# Patient Record
Sex: Female | Born: 1975 | Race: White | Hispanic: No | Marital: Married | Smoking: Former smoker
Health system: Southern US, Community
[De-identification: ages and names within clinical notes are randomized; demographics above are authoritative.]

## PROBLEM LIST (undated history)

## (undated) DIAGNOSIS — T8859XA Other complications of anesthesia, initial encounter: Secondary | ICD-10-CM

## (undated) DIAGNOSIS — R112 Nausea with vomiting, unspecified: Secondary | ICD-10-CM

## (undated) DIAGNOSIS — Z9889 Other specified postprocedural states: Secondary | ICD-10-CM

## (undated) DIAGNOSIS — N159 Renal tubulo-interstitial disease, unspecified: Secondary | ICD-10-CM

## (undated) DIAGNOSIS — Z8719 Personal history of other diseases of the digestive system: Secondary | ICD-10-CM

## (undated) DIAGNOSIS — N179 Acute kidney failure, unspecified: Secondary | ICD-10-CM

## (undated) DIAGNOSIS — J189 Pneumonia, unspecified organism: Secondary | ICD-10-CM

## (undated) DIAGNOSIS — K589 Irritable bowel syndrome without diarrhea: Secondary | ICD-10-CM

## (undated) DIAGNOSIS — T4145XA Adverse effect of unspecified anesthetic, initial encounter: Secondary | ICD-10-CM

## (undated) DIAGNOSIS — C50919 Malignant neoplasm of unspecified site of unspecified female breast: Secondary | ICD-10-CM

## (undated) DIAGNOSIS — K219 Gastro-esophageal reflux disease without esophagitis: Secondary | ICD-10-CM

## (undated) DIAGNOSIS — M1990 Unspecified osteoarthritis, unspecified site: Secondary | ICD-10-CM

## (undated) DIAGNOSIS — M5090 Cervical disc disorder, unspecified, unspecified cervical region: Secondary | ICD-10-CM

## (undated) HISTORY — PX: ABDOMINAL HYSTERECTOMY: SHX81

---

## 1995-10-03 DIAGNOSIS — N179 Acute kidney failure, unspecified: Secondary | ICD-10-CM

## 1995-10-03 HISTORY — DX: Acute kidney failure, unspecified: N17.9

## 2000-06-28 ENCOUNTER — Emergency Department (HOSPITAL_COMMUNITY): Admission: EM | Admit: 2000-06-28 | Discharge: 2000-06-28 | Payer: Self-pay | Admitting: Emergency Medicine

## 2000-06-28 ENCOUNTER — Encounter: Payer: Self-pay | Admitting: Emergency Medicine

## 2001-02-08 ENCOUNTER — Emergency Department (HOSPITAL_COMMUNITY): Admission: EM | Admit: 2001-02-08 | Discharge: 2001-02-08 | Payer: Self-pay | Admitting: Emergency Medicine

## 2001-02-08 ENCOUNTER — Encounter: Payer: Self-pay | Admitting: Emergency Medicine

## 2001-10-04 ENCOUNTER — Ambulatory Visit (HOSPITAL_COMMUNITY): Admission: RE | Admit: 2001-10-04 | Discharge: 2001-10-04 | Payer: Self-pay | Admitting: Gastroenterology

## 2003-10-03 HISTORY — PX: BREAST SURGERY: SHX581

## 2005-10-02 DIAGNOSIS — N159 Renal tubulo-interstitial disease, unspecified: Secondary | ICD-10-CM

## 2005-10-02 HISTORY — DX: Renal tubulo-interstitial disease, unspecified: N15.9

## 2006-04-06 ENCOUNTER — Encounter: Admission: RE | Admit: 2006-04-06 | Discharge: 2006-04-06 | Payer: Self-pay | Admitting: Family Medicine

## 2008-10-12 ENCOUNTER — Other Ambulatory Visit: Admission: RE | Admit: 2008-10-12 | Discharge: 2008-10-12 | Payer: Self-pay | Admitting: Gynecology

## 2008-10-12 ENCOUNTER — Ambulatory Visit: Payer: Self-pay | Admitting: Gynecology

## 2008-10-12 ENCOUNTER — Encounter: Payer: Self-pay | Admitting: Gynecology

## 2008-10-20 ENCOUNTER — Ambulatory Visit: Payer: Self-pay | Admitting: Gynecology

## 2009-01-06 ENCOUNTER — Ambulatory Visit: Payer: Self-pay | Admitting: Gynecology

## 2009-02-04 ENCOUNTER — Ambulatory Visit: Payer: Self-pay | Admitting: Gynecology

## 2009-07-14 ENCOUNTER — Ambulatory Visit: Payer: Self-pay | Admitting: Gynecology

## 2009-07-16 ENCOUNTER — Ambulatory Visit: Payer: Self-pay | Admitting: Gynecology

## 2009-10-02 DIAGNOSIS — J189 Pneumonia, unspecified organism: Secondary | ICD-10-CM

## 2009-10-02 HISTORY — DX: Pneumonia, unspecified organism: J18.9

## 2013-07-22 ENCOUNTER — Emergency Department (HOSPITAL_BASED_OUTPATIENT_CLINIC_OR_DEPARTMENT_OTHER)
Admission: EM | Admit: 2013-07-22 | Discharge: 2013-07-22 | Disposition: A | Discharge status: Home / Self Care | Payer: PRIVATE HEALTH INSURANCE | Attending: Emergency Medicine | Admitting: Emergency Medicine

## 2013-07-22 ENCOUNTER — Encounter (HOSPITAL_BASED_OUTPATIENT_CLINIC_OR_DEPARTMENT_OTHER): Payer: Self-pay | Admitting: Emergency Medicine

## 2013-07-22 DIAGNOSIS — Z880 Allergy status to penicillin: Secondary | ICD-10-CM | POA: Insufficient documentation

## 2013-07-22 DIAGNOSIS — M5412 Radiculopathy, cervical region: Secondary | ICD-10-CM | POA: Insufficient documentation

## 2013-07-22 DIAGNOSIS — G8929 Other chronic pain: Secondary | ICD-10-CM | POA: Insufficient documentation

## 2013-07-22 DIAGNOSIS — F17200 Nicotine dependence, unspecified, uncomplicated: Secondary | ICD-10-CM | POA: Insufficient documentation

## 2013-07-22 HISTORY — DX: Cervical disc disorder, unspecified, unspecified cervical region: M50.90

## 2013-07-22 MED ORDER — ONDANSETRON 4 MG PO TBDP
4.0000 mg | ORAL_TABLET | Freq: Once | ORAL | Status: AC
  Administered 2013-07-22: 4 mg via ORAL
  Filled 2013-07-22: qty 1

## 2013-07-22 MED ORDER — OXYCODONE-ACETAMINOPHEN 5-325 MG PO TABS
1.0000 | ORAL_TABLET | Freq: Once | ORAL | Status: AC
  Administered 2013-07-22: 1 via ORAL
  Filled 2013-07-22: qty 1

## 2013-07-22 MED ORDER — TRAMADOL HCL 50 MG PO TABS: 50.0000 mg | ORAL_TABLET | Freq: Four times a day (QID) | ORAL | Status: DC | PRN

## 2013-07-22 MED ORDER — KETOROLAC TROMETHAMINE 30 MG/ML IJ SOLN
30.0000 mg | Freq: Once | INTRAMUSCULAR | Status: AC
  Administered 2013-07-22: 30 mg via INTRAMUSCULAR
  Filled 2013-07-22: qty 1

## 2013-07-22 NOTE — ED Provider Notes (Signed)
CSN: [id number]     Arrival date & time 07/22/13  2145 History  This chart was scribed for No att. providers found by Ronal Fear, ED Scribe. This patient was seen in room MH10/MH10 and the patient's care was started at 10:12 PM.    Chief Complaint  Patient presents with  . Neck Pain   HPI  HPI Comments: Evelyn Garcia is a 37 y.o. female who presents to the Emergency Department complaining of chronic gradually worsening neck pain for the past 8x months with associated numbness, tingling and weakness with 8/10 pain in her left arm. She states the left arm pain has worsened over the past 4x days. She Has had an MRI that revealed disc damage in C4-6. She was taking ultram for the pain with relief but has now run out. Due to a work related issue she cannot get her medication refilled because she can not get in to see her PCP. Pt denies weakness and numbness in her bilateral legs. Pt has no other medical problems. Pt had a hysterectomy in 2012.  She does not have any other complaints.   Past Medical History  Diagnosis Date  . 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 Abdominal hysterectomy     History reviewed. No pertinent family history. History  Substance Use Topics  . Smoking status: Current Every Day Smoker  . Smokeless tobacco: Not on file  . Alcohol Use: Yes   OB History   Grav Para Term Preterm Abortions TAB SAB Ect Mult Living                 Review of Systems  Genitourinary:       Denies urinary retention  Musculoskeletal: Positive for neck pain.  Neurological: Positive for weakness and numbness. Negative for headaches.    Allergies  Ativan and Penicillins  Home Medications   Current Outpatient Rx  Name  Route  Sig  Dispense  Refill  . traMADol (ULTRAM) 50 MG tablet   Oral   Take 1 tablet (50 mg total) by mouth every 6 (six) hours as needed for pain.   15 tablet   0    BP 132/81  Pulse 106  Temp(Src) 98.1 F (36.7  C) (Oral)  Resp 16  SpO2 99% Physical Exam  Nursing note and vitals reviewed. Constitutional: She is oriented to person, place, and time. She appears well-developed and well-nourished.  HENT:  Head: Normocephalic and atraumatic.  Neck:  TTP over the cervical spine  Cardiovascular: Normal rate, regular rhythm and normal heart sounds.   No murmur heard. Pulmonary/Chest: Effort normal and breath sounds normal. No respiratory distress.  Abdominal: Soft. Bowel sounds are normal. There is no tenderness.  Musculoskeletal: She exhibits no edema.  No deformity noted  Neurological: She is alert and oriented to person, place, and time.  4+/5 strength in LUE with sensation intact, 5/5 strength in all other extremities, normal reflexes, CN II-XII intact  Skin: Skin is warm and dry.  Psychiatric: She has a normal mood and affect.    ED Course  Procedures (including critical care time) DIAGNOSTIC STUDIES: Oxygen Saturation is 99% on RA, normal by my interpretation.    COORDINATION OF CARE:    10:15 PM- Pt advised of plan for treatment including medication for pain and pt agrees.   Labs Review Labs Reviewed - No data to display Imaging Review No results found.  EKG Interpretation  None       MDM   1. Cervical radiculopathy    Patient presents with 8 mo of neck pain and left arm pain and weakness.  Weakness of unchanged but pain increased.  Patient out of medications.  Otherwise, nontoxic.  Patient given toradol and percocet.  Patient will be given a short course of pain medication and is to follow-up with PCP.  Patient already had MRI and has known disc disease.  No indication for further imaging at this time given endorses stable weakness.  After history, exam, and medical workup I feel the patient has been appropriately medically screened and is safe for discharge home. Pertinent diagnoses were discussed with the patient. Patient was given return precautions.   I personally  performed the services described in this documentation, which was scribed in my presence. The recorded information has been reviewed and is accurate.   Shon Baton, MD 07/24/13 [phone]

## 2013-07-22 NOTE — ED Notes (Signed)
Pt c/o neck pain x 8 months with numbness and tingling in L arm. Pt sts she had been diagnosed with C4-C7 disc damage but doesn't have insurance so is unable to follow up with the neurologist she was referred to. Pain score 8/10. A&Ox4 NAd noted.

## 2013-07-22 NOTE — ED Notes (Signed)
Neck pain x 8 months and numbness/tingling down left arm

## 2013-09-19 ENCOUNTER — Encounter (HOSPITAL_COMMUNITY): Payer: Self-pay | Admitting: *Deleted

## 2013-09-19 ENCOUNTER — Other Ambulatory Visit: Payer: Self-pay | Admitting: Neurosurgery

## 2013-09-19 ENCOUNTER — Encounter (HOSPITAL_COMMUNITY): Payer: Self-pay | Admitting: Pharmacy Technician

## 2013-09-21 MED ORDER — DEXAMETHASONE SODIUM PHOSPHATE 10 MG/ML IJ SOLN
10.0000 mg | INTRAMUSCULAR | Status: AC
  Administered 2013-09-22: 10 mg via INTRAVENOUS
  Filled 2013-09-21: qty 1

## 2013-09-22 ENCOUNTER — Encounter (HOSPITAL_COMMUNITY): Payer: Self-pay | Admitting: *Deleted

## 2013-09-22 ENCOUNTER — Ambulatory Visit (HOSPITAL_COMMUNITY)
Admission: RE | Admit: 2013-09-22 | Discharge: 2013-09-23 | Disposition: A | Discharge status: Home / Self Care | Payer: PRIVATE HEALTH INSURANCE | Source: Ambulatory Visit | Attending: Neurosurgery | Admitting: Neurosurgery

## 2013-09-22 ENCOUNTER — Encounter (HOSPITAL_COMMUNITY)
Admission: RE | Disposition: A | Discharge status: Home / Self Care | Payer: Self-pay | Source: Ambulatory Visit | Attending: Neurosurgery

## 2013-09-22 ENCOUNTER — Ambulatory Visit (HOSPITAL_COMMUNITY): Payer: PRIVATE HEALTH INSURANCE | Admitting: Certified Registered"

## 2013-09-22 ENCOUNTER — Encounter (HOSPITAL_COMMUNITY): Payer: PRIVATE HEALTH INSURANCE | Admitting: Certified Registered"

## 2013-09-22 ENCOUNTER — Ambulatory Visit (HOSPITAL_COMMUNITY): Payer: PRIVATE HEALTH INSURANCE

## 2013-09-22 DIAGNOSIS — Z87891 Personal history of nicotine dependence: Secondary | ICD-10-CM | POA: Insufficient documentation

## 2013-09-22 DIAGNOSIS — M5020 Other cervical disc displacement, unspecified cervical region: Secondary | ICD-10-CM | POA: Insufficient documentation

## 2013-09-22 DIAGNOSIS — M5010 Cervical disc disorder with radiculopathy, unspecified cervical region: Secondary | ICD-10-CM | POA: Diagnosis present

## 2013-09-22 HISTORY — DX: Other specified postprocedural states: Z98.890

## 2013-09-22 HISTORY — DX: Other complications of anesthesia, initial encounter: T88.59XA

## 2013-09-22 HISTORY — DX: Renal tubulo-interstitial disease, unspecified: N15.9

## 2013-09-22 HISTORY — DX: Nausea with vomiting, unspecified: R11.2

## 2013-09-22 HISTORY — DX: Gastro-esophageal reflux disease without esophagitis: K21.9

## 2013-09-22 HISTORY — DX: Pneumonia, unspecified organism: J18.9

## 2013-09-22 HISTORY — DX: Unspecified osteoarthritis, unspecified site: M19.90

## 2013-09-22 HISTORY — PX: ANTERIOR CERVICAL DECOMP/DISCECTOMY FUSION: SHX1161

## 2013-09-22 HISTORY — DX: Adverse effect of unspecified anesthetic, initial encounter: T41.45XA

## 2013-09-22 HISTORY — DX: Irritable bowel syndrome, unspecified: K58.9

## 2013-09-22 HISTORY — DX: Acute kidney failure, unspecified: N17.9

## 2013-09-22 HISTORY — DX: Personal history of other diseases of the digestive system: Z87.19

## 2013-09-22 LAB — CBC
MCH: 33.1 pg (ref 26.0–34.0)
MCHC: 34.8 g/dL (ref 30.0–36.0)
MCV: 95 fL (ref 78.0–100.0)
Platelets: 254 10*3/uL (ref 150–400)
RBC: 4.23 MIL/uL (ref 3.87–5.11)
RDW: 12.6 % (ref 11.5–15.5)
WBC: 5.8 10*3/uL (ref 4.0–10.5)

## 2013-09-22 LAB — SURGICAL PCR SCREEN: MRSA, PCR: NEGATIVE

## 2013-09-22 LAB — CREATININE, SERUM: GFR calc non Af Amer: >90 mL/min (ref >90)

## 2013-09-22 SURGERY — ANTERIOR CERVICAL DECOMPRESSION/DISCECTOMY FUSION 2 LEVELS
Anesthesia: General | Site: Neck

## 2013-09-22 MED ORDER — ONDANSETRON HCL 4 MG/2ML IJ SOLN: 4.0000 mg | Freq: Once | INTRAMUSCULAR | Status: DC | PRN

## 2013-09-22 MED ORDER — DOCUSATE SODIUM 100 MG PO CAPS
100.0000 mg | ORAL_CAPSULE | Freq: Two times a day (BID) | ORAL | Status: DC
  Administered 2013-09-22 – 2013-09-23 (×2): 100 mg via ORAL
  Filled 2013-09-22 (×3): qty 1

## 2013-09-22 MED ORDER — HYDROCODONE-ACETAMINOPHEN 5-325 MG PO TABS
1.0000 | ORAL_TABLET | ORAL | Status: DC | PRN
  Administered 2013-09-22 – 2013-09-23 (×5): 2 via ORAL
  Filled 2013-09-22 (×4): qty 2

## 2013-09-22 MED ORDER — MUPIROCIN 2 % EX OINT
TOPICAL_OINTMENT | CUTANEOUS | Status: AC
  Administered 2013-09-22: 1 via NASAL
  Filled 2013-09-22: qty 22

## 2013-09-22 MED ORDER — LIDOCAINE HCL (CARDIAC) 20 MG/ML IV SOLN
INTRAVENOUS | Status: DC | PRN
  Administered 2013-09-22: 40 mg via INTRAVENOUS

## 2013-09-22 MED ORDER — VANCOMYCIN HCL IN DEXTROSE 1-5 GM/200ML-% IV SOLN
1000.0000 mg | INTRAVENOUS | Status: AC
  Administered 2013-09-22: 1000 mg via INTRAVENOUS
  Filled 2013-09-22 (×2): qty 200

## 2013-09-22 MED ORDER — DEXAMETHASONE SODIUM PHOSPHATE 4 MG/ML IJ SOLN
4.0000 mg | Freq: Four times a day (QID) | INTRAMUSCULAR | Status: AC
  Administered 2013-09-22: 4 mg via INTRAVENOUS
  Filled 2013-09-22: qty 1

## 2013-09-22 MED ORDER — HYDROMORPHONE HCL PF 1 MG/ML IJ SOLN
INTRAMUSCULAR | Status: AC
  Filled 2013-09-22: qty 1

## 2013-09-22 MED ORDER — NEOSTIGMINE METHYLSULFATE 1 MG/ML IJ SOLN
INTRAMUSCULAR | Status: DC | PRN
  Administered 2013-09-22: 4 mg via INTRAVENOUS

## 2013-09-22 MED ORDER — SODIUM CHLORIDE 0.9 % IJ SOLN
3.0000 mL | Freq: Two times a day (BID) | INTRAMUSCULAR | Status: DC
  Administered 2013-09-22 (×2): 3 mL via INTRAVENOUS

## 2013-09-22 MED ORDER — HYDROMORPHONE HCL PF 1 MG/ML IJ SOLN
1.0000 mg | INTRAMUSCULAR | Status: DC | PRN
  Administered 2013-09-22 – 2013-09-23 (×5): 1 mg via INTRAMUSCULAR
  Filled 2013-09-22 (×5): qty 1

## 2013-09-22 MED ORDER — CYCLOBENZAPRINE HCL 10 MG PO TABS
10.0000 mg | ORAL_TABLET | Freq: Three times a day (TID) | ORAL | Status: DC | PRN
  Administered 2013-09-22 (×2): 10 mg via ORAL
  Filled 2013-09-22: qty 1

## 2013-09-22 MED ORDER — SODIUM CHLORIDE 0.9 % IJ SOLN: 3.0000 mL | INTRAMUSCULAR | Status: DC | PRN

## 2013-09-22 MED ORDER — MENTHOL 3 MG MT LOZG
1.0000 | LOZENGE | OROMUCOSAL | Status: DC | PRN
  Administered 2013-09-22: 3 mg via ORAL
  Filled 2013-09-22: qty 9

## 2013-09-22 MED ORDER — ACETAMINOPHEN 325 MG PO TABS: 650.0000 mg | ORAL_TABLET | ORAL | Status: DC | PRN

## 2013-09-22 MED ORDER — VANCOMYCIN HCL IN DEXTROSE 1-5 GM/200ML-% IV SOLN
1000.0000 mg | Freq: Once | INTRAVENOUS | Status: AC
  Administered 2013-09-22: 1000 mg via INTRAVENOUS
  Filled 2013-09-22: qty 200

## 2013-09-22 MED ORDER — KCL IN DEXTROSE-NACL 20-5-0.45 MEQ/L-%-% IV SOLN
80.0000 mL/h | INTRAVENOUS | Status: DC
  Filled 2013-09-22 (×3): qty 1000

## 2013-09-22 MED ORDER — 0.9 % SODIUM CHLORIDE (POUR BTL) OPTIME
TOPICAL | Status: DC | PRN
  Administered 2013-09-22: 1000 mL

## 2013-09-22 MED ORDER — THROMBIN 5000 UNITS EX SOLR
CUTANEOUS | Status: DC | PRN
  Administered 2013-09-22 (×2): 5000 [IU] via TOPICAL

## 2013-09-22 MED ORDER — LACTATED RINGERS IV SOLN
INTRAVENOUS | Status: DC | PRN
  Administered 2013-09-22 (×2): via INTRAVENOUS

## 2013-09-22 MED ORDER — MUPIROCIN 2 % EX OINT
TOPICAL_OINTMENT | Freq: Two times a day (BID) | CUTANEOUS | Status: DC
  Filled 2013-09-22: qty 22

## 2013-09-22 MED ORDER — PHENOL 1.4 % MT LIQD
1.0000 | OROMUCOSAL | Status: DC | PRN
  Administered 2013-09-22: 1 via OROMUCOSAL
  Filled 2013-09-22: qty 177

## 2013-09-22 MED ORDER — PROPOFOL 10 MG/ML IV BOLUS
INTRAVENOUS | Status: DC | PRN
  Administered 2013-09-22: 150 mg via INTRAVENOUS

## 2013-09-22 MED ORDER — ARTIFICIAL TEARS OP OINT
TOPICAL_OINTMENT | OPHTHALMIC | Status: DC | PRN
  Administered 2013-09-22: 1 via OPHTHALMIC

## 2013-09-22 MED ORDER — PANTOPRAZOLE SODIUM 40 MG IV SOLR
40.0000 mg | Freq: Every day | INTRAVENOUS | Status: DC
  Administered 2013-09-22: 40 mg via INTRAVENOUS
  Filled 2013-09-22 (×2): qty 40

## 2013-09-22 MED ORDER — HYDROMORPHONE HCL PF 1 MG/ML IJ SOLN
0.2500 mg | INTRAMUSCULAR | Status: DC | PRN
  Administered 2013-09-22 (×2): 0.5 mg via INTRAVENOUS

## 2013-09-22 MED ORDER — ONDANSETRON HCL 4 MG/2ML IJ SOLN
INTRAMUSCULAR | Status: DC | PRN
  Administered 2013-09-22: 4 mg via INTRAVENOUS

## 2013-09-22 MED ORDER — VECURONIUM BROMIDE 10 MG IV SOLR
INTRAVENOUS | Status: DC | PRN
  Administered 2013-09-22: 1 mg via INTRAVENOUS
  Administered 2013-09-22: 2 mg via INTRAVENOUS
  Administered 2013-09-22: 1 mg via INTRAVENOUS

## 2013-09-22 MED ORDER — FENTANYL CITRATE 0.05 MG/ML IJ SOLN
INTRAMUSCULAR | Status: DC | PRN
  Administered 2013-09-22 (×4): 100 ug via INTRAVENOUS
  Administered 2013-09-22 (×2): 50 ug via INTRAVENOUS

## 2013-09-22 MED ORDER — ACETAMINOPHEN 650 MG RE SUPP: 650.0000 mg | RECTAL | Status: DC | PRN

## 2013-09-22 MED ORDER — DEXAMETHASONE 4 MG PO TABS
4.0000 mg | ORAL_TABLET | Freq: Four times a day (QID) | ORAL | Status: AC
  Administered 2013-09-22: 4 mg via ORAL
  Filled 2013-09-22: qty 1

## 2013-09-22 MED ORDER — ZOLPIDEM TARTRATE 5 MG PO TABS: 5.0000 mg | ORAL_TABLET | Freq: Every evening | ORAL | Status: DC | PRN

## 2013-09-22 MED ORDER — ONDANSETRON HCL 4 MG/2ML IJ SOLN: 4.0000 mg | INTRAMUSCULAR | Status: DC | PRN

## 2013-09-22 MED ORDER — CYCLOBENZAPRINE HCL 10 MG PO TABS
ORAL_TABLET | ORAL | Status: AC
  Filled 2013-09-22: qty 1

## 2013-09-22 MED ORDER — MIDAZOLAM HCL 5 MG/5ML IJ SOLN
INTRAMUSCULAR | Status: DC | PRN
  Administered 2013-09-22: 2 mg via INTRAVENOUS

## 2013-09-22 MED ORDER — HEMOSTATIC AGENTS (NO CHARGE) OPTIME
TOPICAL | Status: DC | PRN
  Administered 2013-09-22: 1 via TOPICAL

## 2013-09-22 MED ORDER — HYDROCODONE-ACETAMINOPHEN 5-325 MG PO TABS
ORAL_TABLET | ORAL | Status: AC
  Filled 2013-09-22: qty 2

## 2013-09-22 MED ORDER — ROCURONIUM BROMIDE 100 MG/10ML IV SOLN
INTRAVENOUS | Status: DC | PRN
  Administered 2013-09-22: 50 mg via INTRAVENOUS

## 2013-09-22 MED ORDER — SODIUM CHLORIDE 0.9 % IR SOLN
Status: DC | PRN
  Administered 2013-09-22: 09:00:00

## 2013-09-22 MED ORDER — GLYCOPYRROLATE 0.2 MG/ML IJ SOLN
INTRAMUSCULAR | Status: DC | PRN
  Administered 2013-09-22: 0.6 mg via INTRAVENOUS

## 2013-09-22 SURGICAL SUPPLY — 58 items
APL SKNCLS STERI-STRIP NONHPOA (GAUZE/BANDAGES/DRESSINGS) ×1
BAG DECANTER FOR FLEXI CONT (MISCELLANEOUS) ×2 IMPLANT
BENZOIN TINCTURE PRP APPL 2/3 (GAUZE/BANDAGES/DRESSINGS) ×2 IMPLANT
BIT DRILL TRINICA 2.3MM (BIT) ×1 IMPLANT
BRUSH SCRUB EZ PLAIN DRY (MISCELLANEOUS) ×2 IMPLANT
BUR MATCHSTICK NEURO 3.0 LAGG (BURR) ×2 IMPLANT
CANISTER SUCT 3000ML (MISCELLANEOUS) ×2 IMPLANT
CONT SPEC 4OZ CLIKSEAL STRL BL (MISCELLANEOUS) ×2 IMPLANT
DRAPE C-ARM 42X72 X-RAY (DRAPES) ×4 IMPLANT
DRAPE LAPAROTOMY 100X72 PEDS (DRAPES) ×2 IMPLANT
DRAPE MICROSCOPE ZEISS OPMI (DRAPES) ×2 IMPLANT
DRAPE SURG 17X23 STRL (DRAPES) ×4 IMPLANT
DRESSING TELFA 8X3 (GAUZE/BANDAGES/DRESSINGS) IMPLANT
DRILL BIT TRINICA 2.3MM (BIT) ×2
DRSG OPSITE POSTOP 3X4 (GAUZE/BANDAGES/DRESSINGS) ×2 IMPLANT
DURAPREP 6ML APPLICATOR 50/CS (WOUND CARE) ×2 IMPLANT
ELECT COATED BLADE 2.86 ST (ELECTRODE) ×2 IMPLANT
ELECT REM PT RETURN 9FT ADLT (ELECTROSURGICAL) ×2
ELECTRODE REM PT RTRN 9FT ADLT (ELECTROSURGICAL) ×1 IMPLANT
GAUZE SPONGE 4X4 16PLY XRAY LF (GAUZE/BANDAGES/DRESSINGS) IMPLANT
GLOVE BIO SURGEON STRL SZ8 (GLOVE) ×2 IMPLANT
GLOVE BIOGEL PI IND STRL 7.5 (GLOVE) ×1 IMPLANT
GLOVE BIOGEL PI IND STRL 8.5 (GLOVE) ×1 IMPLANT
GLOVE BIOGEL PI INDICATOR 7.5 (GLOVE) ×1
GLOVE BIOGEL PI INDICATOR 8.5 (GLOVE) ×1
GLOVE ECLIPSE 8.0 STRL XLNG CF (GLOVE) ×2 IMPLANT
GLOVE EXAM NITRILE LRG STRL (GLOVE) IMPLANT
GLOVE EXAM NITRILE MD LF STRL (GLOVE) IMPLANT
GLOVE EXAM NITRILE XL STR (GLOVE) IMPLANT
GLOVE EXAM NITRILE XS STR PU (GLOVE) IMPLANT
GLOVE SURG SS PI 7.0 STRL IVOR (GLOVE) ×6 IMPLANT
GOWN BRE IMP SLV AUR LG STRL (GOWN DISPOSABLE) ×4 IMPLANT
GOWN BRE IMP SLV AUR XL STRL (GOWN DISPOSABLE) ×2 IMPLANT
GOWN STRL REIN 2XL LVL4 (GOWN DISPOSABLE) IMPLANT
HEAD HALTER (SOFTGOODS) ×2 IMPLANT
INTERBODY TM 11X14X5-7DEG ANG (Metal Cage) ×4 IMPLANT
KIT BASIN OR (CUSTOM PROCEDURE TRAY) ×2 IMPLANT
KIT ROOM TURNOVER OR (KITS) ×2 IMPLANT
NEEDLE SPNL 20GX3.5 QUINCKE YW (NEEDLE) ×2 IMPLANT
NS IRRIG 1000ML POUR BTL (IV SOLUTION) ×2 IMPLANT
PACK LAMINECTOMY NEURO (CUSTOM PROCEDURE TRAY) ×2 IMPLANT
PAD ARMBOARD 7.5X6 YLW CONV (MISCELLANEOUS) ×4 IMPLANT
PATTIES SURGICAL .75X.75 (GAUZE/BANDAGES/DRESSINGS) IMPLANT
PLATE 38MM (Plate) ×2 IMPLANT
PUTTY BONE GRAFT KIT 2.5ML (Bone Implant) ×2 IMPLANT
RUBBERBAND STERILE (MISCELLANEOUS) ×4 IMPLANT
SCREW SD FIXED 12MM (Screw) ×12 IMPLANT
SPONGE GAUZE 4X4 12PLY (GAUZE/BANDAGES/DRESSINGS) IMPLANT
SPONGE INTESTINAL PEANUT (DISPOSABLE) ×2 IMPLANT
SPONGE SURGIFOAM ABS GEL SZ50 (HEMOSTASIS) ×2 IMPLANT
STRIP CLOSURE SKIN 1/2X4 (GAUZE/BANDAGES/DRESSINGS) ×2 IMPLANT
SUT PDS AB 5-0 P3 18 (SUTURE) IMPLANT
SUT VIC AB 3-0 CP2 18 (SUTURE) ×4 IMPLANT
SYR 20ML ECCENTRIC (SYRINGE) ×2 IMPLANT
TOWEL OR 17X24 6PK STRL BLUE (TOWEL DISPOSABLE) ×2 IMPLANT
TOWEL OR 17X26 10 PK STRL BLUE (TOWEL DISPOSABLE) ×2 IMPLANT
TRAP SPECIMEN MUCOUS 40CC (MISCELLANEOUS) IMPLANT
WATER STERILE IRR 1000ML POUR (IV SOLUTION) ×2 IMPLANT

## 2013-09-22 NOTE — Preoperative (Signed)
Beta Blockers   Reason not to administer Beta Blockers:Not Applicable 

## 2013-09-22 NOTE — Anesthesia Preprocedure Evaluation (Addendum)
Anesthesia Evaluation  Patient identified by MRN, date of birth, ID band Patient awake    Reviewed: Allergy & Precautions, H&P , NPO status   History of Anesthesia Complications (+) PONV  Airway Mallampati: I TM Distance: >3 FB Neck ROM: Full    Dental  (+) Edentulous Upper, Teeth Intact and Dental Advisory Given   Pulmonary former smoker,  breath sounds clear to auscultation        Cardiovascular Rhythm:Regular Rate:Normal     Neuro/Psych    GI/Hepatic hiatal hernia, GERD-  Medicated and Controlled,  Endo/Other    Renal/GU      Musculoskeletal   Abdominal   Peds  Hematology   Anesthesia Other Findings   Reproductive/Obstetrics                          Anesthesia Physical Anesthesia Plan  ASA: II  Anesthesia Plan: General   Post-op Pain Management:    Induction: Intravenous  Airway Management Planned: Oral ETT  Additional Equipment:   Intra-op Plan:   Post-operative Plan: Extubation in OR  Informed Consent: I have reviewed the patients History and Physical, chart, labs and discussed the procedure including the risks, benefits and alternatives for the proposed anesthesia with the patient or authorized representative who has indicated his/her understanding and acceptance.   Dental advisory given  Plan Discussed with: CRNA and Anesthesiologist  Anesthesia Plan Comments: (Cervical spondylosis GERD Former smoker  Plan GA with oral ETT  Kipp Brood, MD)        Anesthesia Quick Evaluation

## 2013-09-22 NOTE — Anesthesia Procedure Notes (Signed)
Procedure Name: Intubation Date/Time: 09/22/2013 7:55 AM Performed by: De Nurse Pre-anesthesia Checklist: Patient identified, Emergency Drugs available, Suction available, Patient being monitored and Timeout performed Patient Re-evaluated:Patient Re-evaluated prior to inductionOxygen Delivery Method: Circle system utilized Preoxygenation: Pre-oxygenation with 100% oxygen Intubation Type: IV induction Ventilation: Mask ventilation without difficulty Laryngoscope Size: Mac and 3 Grade View: Grade I Tube type: Oral Tube size: 7.0 mm Number of attempts: 1 Airway Equipment and Method: Stylet Placement Confirmation: ETT inserted through vocal cords under direct vision,  positive ETCO2 and breath sounds checked- equal and bilateral Secured at: 21 cm Tube secured with: Tape Dental Injury: Teeth and Oropharynx as per pre-operative assessment

## 2013-09-22 NOTE — Anesthesia Postprocedure Evaluation (Signed)
  Anesthesia Post-op Note  Patient: Evelyn Garcia  Procedure(s) Performed: Procedure(s) with comments: Cervical five-six, Cervical six-seven Anterior Cervical Discectomy and Fusion with Trabecular Metal Plus Plate (N/A) - Cervical five-six, Cervical six-seven Anterior Cervical Discectomy and Fusion with Trabecular Metal Plus Plate  Patient Location: PACU  Anesthesia Type:General  Level of Consciousness: awake, alert  and oriented  Airway and Oxygen Therapy: Patient Spontanous Breathing  Post-op Pain: mild  Post-op Assessment: Post-op Vital signs reviewed, Patient's Cardiovascular Status Stable, Respiratory Function Stable, Patent Airway and Pain level controlled  Post-op Vital Signs: stable  Complications: No apparent anesthesia complications

## 2013-09-22 NOTE — Plan of Care (Signed)
Problem: Consults Goal: Diagnosis - Spinal Surgery Outcome: Completed/Met Date Met:  09/22/13 Cervical Spine Fusion

## 2013-09-22 NOTE — Progress Notes (Signed)
ANTIBIOTIC CONSULT NOTE - INITIAL  Pharmacy Consult for vancomycin Indication: surgical prophylaxis s/p neurosurgery  Allergies  Allergen Reactions  . Erythromycin Base Nausea And Vomiting  . Penicillins     Patient Measurements: Height: 5\' 6"  (167.6 cm) Weight: 158 lb (71.668 kg) IBW/kg (Calculated) : 59.3 Adjusted Body Weight:   Vital Signs: Temp: 98 F (36.7 C) (12/22 1100) Temp src: Oral (12/22 0623) BP: 128/74 mmHg (12/22 1052) Pulse Rate: 91 (12/22 1100) Intake/Output from previous day:   Intake/Output from this shift: Total I/O In: 1000 [I.V.:1000] Out: 50 [Blood:50]  Labs:  Recent Labs  09/22/13 0638  WBC 5.8  HGB 14.0  PLT 254   CrCl is unknown because no creatinine reading has been taken. No results found for this basename: VANCOTROUGH, Leodis Binet, VANCORANDOM, GENTTROUGH, GENTPEAK, GENTRANDOM, TOBRATROUGH, TOBRAPEAK, TOBRARND, AMIKACINPEAK, AMIKACINTROU, AMIKACIN,  in the last 72 hours   Microbiology: Recent Results (from the past 720 hour(s))  SURGICAL PCR SCREEN     Status: None   Collection Time    09/22/13  6:38 AM      Result Value Range Status   MRSA, PCR NEGATIVE  NEGATIVE Final   Staphylococcus aureus NEGATIVE  NEGATIVE Final   Comment:            The Xpert SA Assay (FDA     approved for NASAL specimens     in patients over 27 years of age),     is one component of     a comprehensive surveillance     program.  Test performance has     been validated by [employer] for patients greater     than or equal to 59 year old.     It is not intended     to diagnose infection nor to     guide or monitor treatment.    Medical History: Past Medical History  Diagnosis Date  . 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 Pneumonia 2011  . ARF (acute renal failure) 1997    due to kidney infection  . Kidney infection 2007    due to infection  . GERD (gastroesophageal reflux disease)   . IBS (irritable bowel syndrome)   . H/O hiatal hernia   .  Arthritis   . Complication of anesthesia     wakes up cryimg and hyperventalating  . PONV (postoperative nausea and vomiting)     Medications:  Scheduled:  . cyclobenzaprine      . dexamethasone  4 mg Intravenous Q6H   Or  . dexamethasone  4 mg Oral Q6H  . docusate sodium  100 mg Oral BID  . HYDROcodone-acetaminophen      . HYDROmorphone      . pantoprazole (PROTONIX) IV  40 mg Intravenous QHS  . sodium chloride  3 mL Intravenous Q12H   Infusions:  . dextrose 5 % and 0.45 % NaCl with KCl 20 mEq/L     Assessment: 37 yo female will be put on vancomycin for surgical prophylaxis s/p neurosurgery.  Vancomycin 1g iv x1 was given at 0742.  Per RN, no drain.  Hx of acute renal failure due to infection but SCr is good now at 0.73 (CrCl ~97)  Goal of Therapy:  Vancomycin trough level 15-20 mcg/ml  Plan:  1) Vancomycin 1g iv x1 at 1930 tonight. Pharmacy will sign off  Joliene Salvador, Tsz-Yin 09/22/2013,11:26 AM

## 2013-09-22 NOTE — Transfer of Care (Signed)
Immediate Anesthesia Transfer of Care Note  Patient: Evelyn Garcia  Procedure(s) Performed: Procedure(s) with comments: Cervical five-six, Cervical six-seven Anterior Cervical Discectomy and Fusion with Trabecular Metal Plus Plate (N/A) - Cervical five-six, Cervical six-seven Anterior Cervical Discectomy and Fusion with Trabecular Metal Plus Plate  Patient Location: PACU  Anesthesia Type:General  Level of Consciousness: awake and oriented  Airway & Oxygen Therapy: Patient Spontanous Breathing and Patient connected to nasal cannula oxygen  Post-op Assessment: Report given to PACU RN and Patient moving all extremities X 4  Post vital signs: Reviewed and stable  Complications: No apparent anesthesia complications

## 2013-09-22 NOTE — H&P (Signed)
Evelyn Garcia is an 37 y.o. female.   Chief Complaint: Neck and left greater than right arm pain HPI: The patient is a 37 year old female who presented with neck and bilateral arm pain which was worse on the left and the right. She has had this problem for number of months and was tried aggressive conservative therapy without improvement. She underwent imaging studies which showed disc abnormalities at C5-6 and C6-7. After failing further conservative therapy the patient requested surgery and now comes for a two-level anterior cervical discectomy with fusion and plating. I have had a long discussion with her regarding the risks and benefits of surgical intervention. The risks discussed include but are not limited to bleeding infection weakness numbness paralysis spinal fluid leak coma quadriplegia hoarseness and death. We have discussed alternative methods of therapy along with the risks and benefits of nonintervention. She's had the opportunity to ask numerous questions and appears to understand. With this information in hand she has requested we proceed with surgery. Past Medical History  Diagnosis Date  . 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 Pneumonia 2011  . ARF (acute renal failure) 1997    due to kidney infection  . Kidney infection 2007    due to infection  . GERD (gastroesophageal reflux disease)   . IBS (irritable bowel syndrome)   . H/O hiatal hernia   . Arthritis   . Complication of anesthesia     wakes up cryimg and hyperventalating  . PONV (postoperative nausea and vomiting)     Past Surgical History  Procedure Laterality Date  . Abdominal hysterectomy    . Breast surgery Bilateral 2005    Breast Augmentation    History reviewed. No pertinent family history. Social History:  reports that she quit smoking about 3 weeks ago. She does not have any smokeless tobacco history on file. She reports that she drinks about 1.5 ounces of alcohol per week. She reports that she does not use  illicit drugs.  Allergies:  Allergies  Allergen Reactions  . Erythromycin Base Nausea And Vomiting  . Penicillins     Medications Prior to Admission  Medication Sig Dispense Refill  . omeprazole (PRILOSEC) 40 MG capsule Take 40 mg by mouth daily.      . polyethylene glycol (MIRALAX / GLYCOLAX) packet Take 17 g by mouth daily.      . traMADol (ULTRAM) 50 MG tablet Take 1 tablet (50 mg total) by mouth every 6 (six) hours as needed for pain.  15 tablet  0    Results for orders placed during the hospital encounter of 09/22/13 (from the past 48 hour(s))  CBC     Status: None   Collection Time    09/22/13  6:38 AM      Result Value Range   WBC 5.8  4.0 - 10.5 K/uL   RBC 4.23  3.87 - 5.11 MIL/uL   Hemoglobin 14.0  12.0 - 15.0 g/dL   HCT 16.1  09.6 - 04.5 %   MCV 95.0  78.0 - 100.0 fL   MCH 33.1  26.0 - 34.0 pg   MCHC 34.8  30.0 - 36.0 g/dL   RDW 40.9  81.1 - 91.4 %   Platelets 254  150 - 400 K/uL   No results found.  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 rate 20, height 5'  6" (1.676 m), weight 71.668 kg (158 lb), SpO2 99.00%.  She is awake or and oriented. His no facial asymmetry. Her gait is nonantalgic. Reflexes are somewhat decreased. Her strength however is 5 over 5 except for some mild tricep weakness on the left her sensation is intact Assessment/Plan Impression is that of this disease at C5-6 and C6-7. The plan is for a two-level anterior cervical discectomy with fusion and plating  Reinaldo Meeker, MD 09/22/2013, 7:35 AM

## 2013-09-22 NOTE — Op Note (Signed)
Preop diagnosis: Herniated disc C5-6 C6-7 Postop diagnosis: Same Procedure: C5-6 C6-7 decompressive anterior cervical discectomy with trabecular metal interbody fusion and Trinica anterior cervical plating Surgeon: Elias Dennington Assistant: Venetia Maxon  After and placed in the supine position and 10 pounds halter traction the patient's neck was prepped and draped in the usual sterile fashion. Localizing fluoroscopy was used prior to incision to identify the appropriate levels. Transverse incision was made in the right anterior neck started midline headed towards the medial aspect of the sternal cremaster muscle. The platysma muscle was then incised transversely. The natural fascial plane between the strap muscles medially and the sternal mastoid laterally was identified and followed down to the anterior aspect of the cervical spine. Longus Cole muscles were identified split in the midline and Triple-A bilaterally with unipolar coagulation and Kitner dissection. Self-retaining tract was placed for exposure and x-ray showed approach to the appropriate levels. Using a 15 blade the S. the disc at C5-6 and C6-7 was incised. Using pituitary rongeurs and curettes approximately 90% of the disc material was removed at both levels. High-speed drill was used to widen the interspace and bony shavings were sent for use later in the case. At this time the microscope was draped brought into the field and used for the remainder of the case. Using microdissection technique the remainder of the disc material down to the posterior longitudinal ligament was removed. Ligament was then incised transversely and the cut edges removed a Kerrison punch. Thorough decompression was carried out on the spinal dura into the foramen bilaterally to decompress the nerve roots particularly on the left were segment excised. Attention was then turned to C5-C6 were similar decompression was carried out. Once again generous decompression was carried out on the  spinal dura into the foramen bilaterally until the underlying C6 nerve root were well visualized well decompressed. At this time inspection was carried out at both levels for any evidence of residual compression and none could be identified. 25 mm lordotic trabecular metal graft were chosen and filled a mixture of autologous bone and morselized allograft. Copious irrigation was carried out and any bleeding control proper coagulation Gelfoam. The posterior then impacted of difficulty and fossae showed to be in excellent position. An appropriately length Trinica anterior cervical plate was then chosen under fluoroscopic guidance drill holes were placed followed by placing of 12 mm screws x6. Locking mechanism was rotated to locked position and fossae showed good positioning of the plates screws and plugs. Irrigation was carried out and any bleeding control proper coagulation. The wounds and closed with inverted Vicryl on the platysma muscle in the subcuticular layer and Steri-Strips were placed on the skin. A sterile dressing was applied and the patient was extubated and taken to recovery room in stable condition.

## 2013-09-23 MED ORDER — HYDROCODONE-ACETAMINOPHEN 5-325 MG PO TABS: 1.0000 | ORAL_TABLET | ORAL | Status: DC | PRN

## 2013-09-23 NOTE — Discharge Summary (Signed)
  Physician Discharge Summary  Patient ID: Evelyn Garcia MRN: [id number] DOB/AGE: 1976/05/04 37 y.o.  Admit date: 09/22/2013 Discharge date: 09/23/2013  Admission Diagnoses:  Discharge Diagnoses:  Active Problems:   Herniation of cervical intervertebral disc with radiculopathy   Discharged Condition: good  Hospital Course: Surgery one day for acdf. Did well post op. Good relief of numbness and pain. Wound fine. Ambulated well. Home pod 1, specific instructions given.  Consults: None  Significant Diagnostic Studies: none  Treatments: surgery: C 56 C 67 acdf  Discharge Exam: Blood pressure 120/77, pulse 80, temperature 99 F (37.2 C), temperature source Oral, resp. rate 18, height 5\' 6"  (1.676 m), weight 71.668 kg (158 lb), SpO2 97.00%. clean and dry; no new neuro issues  Disposition: 01-Home or Self Care     Medication List    ASK your doctor about these medications       omeprazole 40 MG capsule  Commonly known as:  PRILOSEC  Take 40 mg by mouth daily.     polyethylene glycol packet  Commonly known as:  MIRALAX / GLYCOLAX  Take 17 g by mouth daily.     traMADol 50 MG tablet  Commonly known as:  ULTRAM  Take 1 tablet (50 mg total) by mouth every 6 (six) hours as needed for pain.         At home rest most of the time. Get up 9 or 10 times each day and take a 15 or 20 minute walk. No riding in the car and to your first postoperative appointment. If you have neck surgery you may shower from the chest down starting on the third postoperative day. If you had back surgery he may start showering on the third postoperative day with saran wrap wrapped around your incisional area 3 times. After the shower remove the saran wrap. Take pain medicine as needed and other medications as instructed. Call my office for an appointment.  SignedReinaldo Meeker, MD 09/23/2013, 12:22 PM

## 2013-09-23 NOTE — Progress Notes (Signed)
Pt. discharged home accompanied by husband. Prescriptions and discharge instructions given with verbalization of understanding. Incision site on neck with no s/s of infection - no swelling, redness, bleeding, and/or drainage noted. Soft collar intact. Pain med given just before leaving. Opportunity given to ask questions but no question asked.  

## 2013-09-24 ENCOUNTER — Encounter (HOSPITAL_COMMUNITY): Payer: Self-pay | Admitting: Neurosurgery

## 2018-03-27 ENCOUNTER — Emergency Department (HOSPITAL_COMMUNITY): Payer: 59

## 2018-03-27 ENCOUNTER — Emergency Department (HOSPITAL_BASED_OUTPATIENT_CLINIC_OR_DEPARTMENT_OTHER)
Admission: EM | Admit: 2018-03-27 | Discharge: 2018-03-27 | Disposition: A | Discharge status: Home / Self Care | Payer: 59 | Attending: Emergency Medicine | Admitting: Emergency Medicine

## 2018-03-27 ENCOUNTER — Emergency Department (HOSPITAL_BASED_OUTPATIENT_CLINIC_OR_DEPARTMENT_OTHER): Payer: 59

## 2018-03-27 ENCOUNTER — Encounter (HOSPITAL_BASED_OUTPATIENT_CLINIC_OR_DEPARTMENT_OTHER): Payer: Self-pay | Admitting: Emergency Medicine

## 2018-03-27 ENCOUNTER — Other Ambulatory Visit: Payer: Self-pay

## 2018-03-27 DIAGNOSIS — R200 Anesthesia of skin: Secondary | ICD-10-CM | POA: Insufficient documentation

## 2018-03-27 DIAGNOSIS — R479 Unspecified speech disturbances: Secondary | ICD-10-CM | POA: Insufficient documentation

## 2018-03-27 DIAGNOSIS — R51 Headache: Secondary | ICD-10-CM | POA: Diagnosis present

## 2018-03-27 DIAGNOSIS — R42 Dizziness and giddiness: Secondary | ICD-10-CM | POA: Insufficient documentation

## 2018-03-27 DIAGNOSIS — Z87891 Personal history of nicotine dependence: Secondary | ICD-10-CM | POA: Insufficient documentation

## 2018-03-27 DIAGNOSIS — R29810 Facial weakness: Secondary | ICD-10-CM | POA: Diagnosis not present

## 2018-03-27 DIAGNOSIS — R519 Headache, unspecified: Secondary | ICD-10-CM

## 2018-03-27 HISTORY — DX: Malignant neoplasm of unspecified site of unspecified female breast: C50.919

## 2018-03-27 LAB — COMPREHENSIVE METABOLIC PANEL
ALT: 21 U/L (ref 0–44)
AST: 21 U/L (ref 15–41)
Albumin: 4.3 g/dL (ref 3.5–5.0)
Alkaline Phosphatase: 64 U/L (ref 38–126)
Anion gap: 8 (ref 5–15)
BUN: 9 mg/dL (ref 6–20)
CO2: 27 mmol/L (ref 22–32)
Calcium: 9.5 mg/dL (ref 8.9–10.3)
Chloride: 107 mmol/L (ref 98–111)
Creatinine, Ser: 0.65 mg/dL (ref 0.44–1.00)
GFR calc Af Amer: >60 mL/min (ref >60)
GFR calc non Af Amer: >60 mL/min (ref >60)
Glucose, Bld: 95 mg/dL (ref 70–99)
Potassium: 4.1 mmol/L (ref 3.5–5.1)
Sodium: 142 mmol/L (ref 135–145)
Total Bilirubin: 0.5 mg/dL (ref 0.3–1.2)
Total Protein: 7.7 g/dL (ref 6.5–8.1)

## 2018-03-27 LAB — DIFFERENTIAL
Basophils Absolute: 0 10*3/uL (ref 0.0–0.1)
Basophils Relative: 1 %
Eosinophils Absolute: 0.2 10*3/uL (ref 0.0–0.7)
Eosinophils Relative: 3 %
Lymphocytes Relative: 42 %
Lymphs Abs: 2.4 10*3/uL (ref 0.7–4.0)
Monocytes Absolute: 0.4 10*3/uL (ref 0.1–1.0)
Monocytes Relative: 7 %
Neutro Abs: 2.7 10*3/uL (ref 1.7–7.7)
Neutrophils Relative %: 47 %

## 2018-03-27 LAB — URINALYSIS, ROUTINE W REFLEX MICROSCOPIC
Bilirubin Urine: NEGATIVE
Glucose, UA: NEGATIVE mg/dL
Hgb urine dipstick: NEGATIVE
Ketones, ur: NEGATIVE mg/dL
Leukocytes, UA: NEGATIVE
Nitrite: NEGATIVE
Protein, ur: NEGATIVE mg/dL
Specific Gravity, Urine: <1.005 — ABNORMAL LOW (ref 1.005–1.030)
pH: 6.5 (ref 5.0–8.0)

## 2018-03-27 LAB — APTT: aPTT: 27 seconds (ref 24–36)

## 2018-03-27 LAB — RAPID URINE DRUG SCREEN, HOSP PERFORMED
AMPHETAMINES: NOT DETECTED
Benzodiazepines: DETECTED — AB
Cocaine: NOT DETECTED
Opiates: NOT DETECTED
TETRAHYDROCANNABINOL: NOT DETECTED

## 2018-03-27 LAB — CBC
HEMATOCRIT: 41.4 % (ref 36.0–46.0)
HEMOGLOBIN: 14 g/dL (ref 12.0–15.0)
MCH: 29.9 pg (ref 26.0–34.0)
MCHC: 33.8 g/dL (ref 30.0–36.0)
MCV: 88.5 fL (ref 78.0–100.0)
Platelets: 223 10*3/uL (ref 150–400)
RBC: 4.68 MIL/uL (ref 3.87–5.11)
RDW: 12.9 % (ref 11.5–15.5)
WBC: 5.7 10*3/uL (ref 4.0–10.5)

## 2018-03-27 LAB — TROPONIN I

## 2018-03-27 LAB — ETHANOL

## 2018-03-27 LAB — PROTIME-INR
INR: 0.95
Prothrombin Time: 12.6 seconds (ref 11.4–15.2)

## 2018-03-27 MED ORDER — ONDANSETRON HCL 4 MG/2ML IJ SOLN
4.0000 mg | Freq: Once | INTRAMUSCULAR | Status: AC
  Administered 2018-03-27: 4 mg via INTRAVENOUS
  Filled 2018-03-27: qty 2

## 2018-03-27 MED ORDER — VALPROATE SODIUM 500 MG/5ML IV SOLN
1000.0000 mg | Freq: Once | INTRAVENOUS | Status: AC
  Administered 2018-03-27: 1000 mg via INTRAVENOUS
  Filled 2018-03-27: qty 10

## 2018-03-27 MED ORDER — IOPAMIDOL (ISOVUE-370) INJECTION 76%
100.0000 mL | Freq: Once | INTRAVENOUS | Status: AC | PRN
  Administered 2018-03-27: 100 mL via INTRAVENOUS

## 2018-03-27 MED ORDER — LORAZEPAM 2 MG/ML IJ SOLN
1.0000 mg | Freq: Once | INTRAMUSCULAR | Status: AC
  Administered 2018-03-27: 1 mg via INTRAVENOUS
  Filled 2018-03-27: qty 1

## 2018-03-27 MED ORDER — DEXAMETHASONE SODIUM PHOSPHATE 10 MG/ML IJ SOLN
10.0000 mg | Freq: Once | INTRAMUSCULAR | Status: AC
  Administered 2018-03-27: 10 mg via INTRAVENOUS
  Filled 2018-03-27: qty 1

## 2018-03-27 MED ORDER — METOCLOPRAMIDE HCL 5 MG/ML IJ SOLN
10.0000 mg | Freq: Once | INTRAMUSCULAR | Status: AC
  Administered 2018-03-27: 10 mg via INTRAVENOUS
  Filled 2018-03-27: qty 2

## 2018-03-27 MED ORDER — KETOROLAC TROMETHAMINE 15 MG/ML IJ SOLN
15.0000 mg | Freq: Once | INTRAMUSCULAR | Status: AC
  Administered 2018-03-27: 15 mg via INTRAVENOUS
  Filled 2018-03-27: qty 1

## 2018-03-27 MED ORDER — SODIUM CHLORIDE 0.9 % IV SOLN
Freq: Once | INTRAVENOUS | Status: AC
  Administered 2018-03-27: 14:00:00 via INTRAVENOUS

## 2018-03-27 MED ORDER — DIPHENHYDRAMINE HCL 50 MG/ML IJ SOLN
50.0000 mg | Freq: Once | INTRAMUSCULAR | Status: AC
  Administered 2018-03-27: 50 mg via INTRAVENOUS
  Filled 2018-03-27: qty 1

## 2018-03-27 MED ORDER — SODIUM CHLORIDE 0.9 % IV BOLUS
1000.0000 mL | Freq: Once | INTRAVENOUS | Status: AC
  Administered 2018-03-27: 1000 mL via INTRAVENOUS

## 2018-03-27 MED ORDER — HYDROMORPHONE HCL 1 MG/ML IJ SOLN
1.0000 mg | Freq: Once | INTRAMUSCULAR | Status: AC
  Administered 2018-03-27: 1 mg via INTRAVENOUS
  Filled 2018-03-27: qty 1

## 2018-03-27 MED ORDER — SODIUM CHLORIDE 0.9 % IV SOLN: INTRAVENOUS | Status: DC

## 2018-03-27 MED ORDER — GABAPENTIN 300 MG PO CAPS: 300.0000 mg | ORAL_CAPSULE | Freq: Three times a day (TID) | ORAL | 0 refills | Status: AC

## 2018-03-27 NOTE — ED Notes (Signed)
Patient transported to MRI 

## 2018-03-27 NOTE — ED Notes (Signed)
Patient verbalizes understanding of discharge instructions. Opportunity for questioning and answers were provided. Armband removed by staff, pt discharged from ED.  

## 2018-03-27 NOTE — ED Provider Notes (Signed)
[facility] EMERGENCY DEPARTMENT Provider Note   CSN: [id number] Arrival date & time: 03/27/18  0830     History   Chief Complaint Chief Complaint  Patient presents with  . Numbness    HPI Evelyn Garcia is a 42 y.o. female.  Patient yesterday at 8 in the morning after being up for a while had acute onset of bilateral frontal headache.  Patient goes to work early uses the chin there works out and showers and dresses at work.  Shortly after the onset of the bilateral forehead headache patient started to have left temporal area burning and numbness and then it progressed down into the left part of her face.  Patient has numbness as well to the left side of the face and there is weakness of the muscles on that side.  Patient denies any other complaints.  However there was some question of some left leg subtle weakness.  Patient had a history of migraines in the past but is never had a complicated migraine.  Speech seems to be off but that could be due to the facial numbness and facial weakness.     Past Medical History:  Diagnosis Date  . ARF (acute renal failure) ([place]) 1997   due to kidney infection  . Arthritis   . Breast cancer ([place])   . 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 Complication of anesthesia    wakes up cryimg and hyperventalating  . GERD (gastroesophageal reflux disease)   . H/O hiatal hernia   . IBS (irritable bowel syndrome)   . Kidney infection 2007   due to infection  . Pneumonia 2011  . PONV (postoperative nausea and vomiting)     Patient Active Problem List   Diagnosis Date Noted  . Herniation of cervical intervertebral disc with radiculopathy 09/22/2013    Past Surgical History:  Procedure Laterality Date  . ABDOMINAL HYSTERECTOMY    . ANTERIOR CERVICAL DECOMP/DISCECTOMY FUSION N/A 09/22/2013   Procedure: Cervical five-six, Cervical six-seven Anterior Cervical Discectomy and Fusion with Trabecular Metal Plus Plate;  Surgeon: Faythe Ghee,  MD;  Location: [facility];  Service: Neurosurgery;  Laterality: N/A;  Cervical five-six, Cervical six-seven Anterior Cervical Discectomy and Fusion with Trabecular Metal Plus Plate  . BREAST SURGERY Bilateral 2005   Breast Augmentation     OB History   None      Home Medications    Prior to Admission medications   Medication Sig Start Date End Date Taking? Authorizing Provider  ALPRAZolam Duanne Moron) 0.25 MG tablet Take 1 tablet by mouth at bedtime as needed. 03/25/18   [provider]    Family History No family history on file.  Social History Social History   Tobacco Use  . Smoking status: Former Smoker    Years: 20.00    Last attempt to quit: 08/30/2013    Years since quitting: 4.5  . Smokeless tobacco: Never Used  Substance Use Topics  . Alcohol use: Yes    Alcohol/week: 1.8 oz    Types: 3 drink(s) per week  . Drug use: No     Allergies   Erythromycin base; Hydrocodone; and Penicillins   Review of Systems Review of Systems  Constitutional: Negative for fever.  HENT: Negative for congestion.   Eyes: Negative for visual disturbance.  Respiratory: Negative for cough and shortness of breath.   Cardiovascular: Negative for chest pain.  Gastrointestinal: Negative for abdominal pain, nausea and vomiting.  Genitourinary: Negative for dysuria.  Musculoskeletal: Negative for back pain and neck pain.  Skin: Negative for rash.  Neurological: Positive for facial asymmetry, speech difficulty, weakness, numbness and headaches. Negative for dizziness, seizures and syncope.  Hematological: Does not bruise/bleed easily.  Psychiatric/Behavioral: Negative for confusion.     Physical Exam Updated Vital Signs BP (!) 93/57   Pulse (!) 59   Temp 97.9 F (36.6 C) (Oral)   Resp 18   Ht 1.702 m (5\' 7" )   Wt 63 kg (139 lb)   SpO2 100%   BMI 21.77 kg/m   Physical Exam  Constitutional: She is oriented to person, place, and time. She appears well-developed and  well-nourished. No distress.  HENT:  Head: Normocephalic and atraumatic.  Mouth/Throat: Oropharynx is clear and moist.  Eyes: Pupils are equal, round, and reactive to light. Conjunctivae and EOM are normal.  Neck: Neck supple.  Cardiovascular: Normal rate, regular rhythm and normal heart sounds.  Pulmonary/Chest: Effort normal and breath sounds normal. No respiratory distress.  Abdominal: Soft. Bowel sounds are normal. There is no tenderness.  Musculoskeletal: Normal range of motion. She exhibits no edema.  Neurological: She is alert and oriented to person, place, and time. A cranial nerve deficit is present. She exhibits abnormal muscle tone.  Patient with left-sided facial weakness with sparing of the forehead.  Patient also with left-sided facial numbness.  Patient with subtle weakness to the left upper extremity and left lower extremity.  Nursing note and vitals reviewed.    ED Treatments / Results  Labs (all labs ordered are listed, but only abnormal results are displayed) Labs Reviewed  RAPID URINE DRUG SCREEN, HOSP PERFORMED - Abnormal; Notable for the following components:      Result Value   Benzodiazepines DETECTED (*)    Barbiturates   (*)    Value: Result not available. Reagent lot number recalled by manufacturer.   All other components within normal limits  URINALYSIS, ROUTINE W REFLEX MICROSCOPIC - Abnormal; Notable for the following components:   Specific Gravity, Urine <1.005 (*)    All other components within normal limits  ETHANOL  PROTIME-INR  APTT  CBC  DIFFERENTIAL  COMPREHENSIVE METABOLIC PANEL  TROPONIN I    EKG EKG Interpretation  Date/Time:  Wednesday March 27 2018 09:53:23 EDT Ventricular Rate:  55 PR Interval:    QRS Duration: 85 QT Interval:  424 QTC Calculation: 406 R Axis:   66 Text Interpretation:  Sinus rhythm Prolonged PR interval No previous ECGs available Confirmed by Fredia Sorrow [phone]) on 03/27/2018 10:02:03 AM   Radiology Ct  Angio Head W Or Wo Contrast  Result Date: 03/27/2018 CLINICAL DATA:  Acute onset of left-sided facial numbness and dizziness beginning this morning. EXAM: CT ANGIOGRAPHY HEAD AND NECK TECHNIQUE: Multidetector CT imaging of the head and neck was performed using the standard protocol during bolus administration of intravenous contrast. Multiplanar CT image reconstructions and MIPs were obtained to evaluate the vascular anatomy. Carotid stenosis measurements (when applicable) are obtained utilizing NASCET criteria, using the distal internal carotid diameter as the denominator. CONTRAST:  163mL ISOVUE-370 IOPAMIDOL (ISOVUE-370) INJECTION 76% COMPARISON:  None. FINDINGS: CT HEAD FINDINGS Brain: The noncontrast images the brain demonstrate no acute or subacute infarction. No hemorrhage or mass lesion is present. Basal ganglia are intact. Insular ribbon is normal. No focal cortical lesions are present. The brainstem and cerebellum are normal. Vascular: No hyperdense vessel or unexpected calcification. Skull: The skull base is within normal limits. Sinuses: The paranasal sinuses and mastoid air cells are  clear. Orbits: Globes and orbits are within normal limits. Review of the MIP images confirms the above findings CTA NECK FINDINGS Aortic arch: A 3 vessel arch configuration is present. Aortic arch is unremarkable. Right carotid system: The right carotid artery is within normal limits. Bifurcation is unremarkable. Cervical right ICA is normal. Left carotid system: The left common carotid artery is within normal limits. Bifurcation is unremarkable. Cervical left ICA is normal. Vertebral arteries: The vertebral arteries originate from the subclavian arteries without significant stenosis. The vertebral arteries are codominant. No focal stenosis or vascular injury is present to either vertebral artery in the neck. Skeleton: Anterior cervical spine fusion present at C5-6 and C6-7. Vertebral body heights and alignment are  otherwise normal. Other neck: The soft tissues of the neck are otherwise unremarkable. Thyroid is normal. No significant adenopathy is present. Salivary glands are within normal limits. Upper chest: Lung apices are clear. Thoracic inlet is within normal limits. Review of the MIP images confirms the above findings CTA HEAD FINDINGS Anterior circulation: Internal carotid arteries are within normal limits from the skull base through the ICA termini bilaterally. The A1 and M1 segments are normal. Anterior communicating artery is patent. MCA bifurcations are normal. The ACA and MCA branch vessels are within normal limits. Posterior circulation: Vertebral arteries are codominant. PICA origins are visualized and within normal limits. Vertebrobasilar junction is within normal limits. Both posterior cerebral arteries originate basilar tip. PCA branch vessels are within normal limits. Venous sinuses: Dural sinuses are patent. The straight sinus and deep cerebral veins are intact. Cortical veins are normal. Anatomic variants: None Delayed phase: The postcontrast images demonstrate no pathologic enhancement gray-white differentiation is preserved. Review of the MIP images confirms the above findings IMPRESSION: 1. Negative CTA of the neck. 2. Normal CTA circle-of-Willis without significant proximal stenosis, aneurysm, or branch vessel occlusion. 3. No acute or focal lesion to explain left-sided facial numbness. 4. Postsurgical changes at C5-6 and C6-7. Electronically Signed   By: San Morelle M.D.   On: 03/27/2018 11:39   Ct Angio Neck W Or Wo Contrast  Result Date: 03/27/2018 CLINICAL DATA:  Acute onset of left-sided facial numbness and dizziness beginning this morning. EXAM: CT ANGIOGRAPHY HEAD AND NECK TECHNIQUE: Multidetector CT imaging of the head and neck was performed using the standard protocol during bolus administration of intravenous contrast. Multiplanar CT image reconstructions and MIPs were obtained to  evaluate the vascular anatomy. Carotid stenosis measurements (when applicable) are obtained utilizing NASCET criteria, using the distal internal carotid diameter as the denominator. CONTRAST:  185mL ISOVUE-370 IOPAMIDOL (ISOVUE-370) INJECTION 76% COMPARISON:  None. FINDINGS: CT HEAD FINDINGS Brain: The noncontrast images the brain demonstrate no acute or subacute infarction. No hemorrhage or mass lesion is present. Basal ganglia are intact. Insular ribbon is normal. No focal cortical lesions are present. The brainstem and cerebellum are normal. Vascular: No hyperdense vessel or unexpected calcification. Skull: The skull base is within normal limits. Sinuses: The paranasal sinuses and mastoid air cells are clear. Orbits: Globes and orbits are within normal limits. Review of the MIP images confirms the above findings CTA NECK FINDINGS Aortic arch: A 3 vessel arch configuration is present. Aortic arch is unremarkable. Right carotid system: The right carotid artery is within normal limits. Bifurcation is unremarkable. Cervical right ICA is normal. Left carotid system: The left common carotid artery is within normal limits. Bifurcation is unremarkable. Cervical left ICA is normal. Vertebral arteries: The vertebral arteries originate from the subclavian arteries without significant stenosis. The  vertebral arteries are codominant. No focal stenosis or vascular injury is present to either vertebral artery in the neck. Skeleton: Anterior cervical spine fusion present at C5-6 and C6-7. Vertebral body heights and alignment are otherwise normal. Other neck: The soft tissues of the neck are otherwise unremarkable. Thyroid is normal. No significant adenopathy is present. Salivary glands are within normal limits. Upper chest: Lung apices are clear. Thoracic inlet is within normal limits. Review of the MIP images confirms the above findings CTA HEAD FINDINGS Anterior circulation: Internal carotid arteries are within normal limits  from the skull base through the ICA termini bilaterally. The A1 and M1 segments are normal. Anterior communicating artery is patent. MCA bifurcations are normal. The ACA and MCA branch vessels are within normal limits. Posterior circulation: Vertebral arteries are codominant. PICA origins are visualized and within normal limits. Vertebrobasilar junction is within normal limits. Both posterior cerebral arteries originate basilar tip. PCA branch vessels are within normal limits. Venous sinuses: Dural sinuses are patent. The straight sinus and deep cerebral veins are intact. Cortical veins are normal. Anatomic variants: None Delayed phase: The postcontrast images demonstrate no pathologic enhancement gray-white differentiation is preserved. Review of the MIP images confirms the above findings IMPRESSION: 1. Negative CTA of the neck. 2. Normal CTA circle-of-Willis without significant proximal stenosis, aneurysm, or branch vessel occlusion. 3. No acute or focal lesion to explain left-sided facial numbness. 4. Postsurgical changes at C5-6 and C6-7. Electronically Signed   By: San Morelle M.D.   On: 03/27/2018 11:39    Procedures Procedures (including critical care time)  CRITICAL CARE Performed by: Fredia Sorrow Total critical care time: 30 minutes Critical care time was exclusive of separately billable procedures and treating other patients. Critical care was necessary to treat or prevent imminent or life-threatening deterioration. Critical care was time spent personally by me on the following activities: development of treatment plan with patient and/or surrogate as well as nursing, discussions with consultants, evaluation of patient's response to treatment, examination of patient, obtaining history from patient or surrogate, ordering and performing treatments and interventions, ordering and review of laboratory studies, ordering and review of radiographic studies, pulse oximetry and re-evaluation  of patient's condition.   Medications Ordered in ED Medications  0.9 %  sodium chloride infusion (has no administration in time range)  sodium chloride 0.9 % bolus 1,000 mL (0 mLs Intravenous Stopped 03/27/18 1211)  metoCLOPramide (REGLAN) injection 10 mg (10 mg Intravenous Given 03/27/18 0958)  diphenhydrAMINE (BENADRYL) injection 50 mg (50 mg Intravenous Given 03/27/18 0958)  dexamethasone (DECADRON) injection 10 mg (10 mg Intravenous Given 03/27/18 0959)  iopamidol (ISOVUE-370) 76 % injection 100 mL (100 mLs Intravenous Contrast Given 03/27/18 1053)     Initial Impression / Assessment and Plan / ED Course  I have reviewed the triage vital signs and the nursing notes.  Pertinent labs & imaging results that were available during my care of the patient were reviewed by me and considered in my medical decision making (see chart for details).     Symptoms concerning for possible stroke but very well could be complicated migraine.  Patient had a history of migraines in the past but is never had a complicated migraine.  Onset of headache being bilateral forehead I think also rules out the possibility of zoster plus the left upper extremity and left lower extremity weakness rules that out as well.  CT head here negative CT angios neck and head without any significant abnormalities.  Discussed with Dr.Kirkpatrick on call neuro  hospitalist who concurs with MRI.  Patient be transferred into [facility] for MRI to rule out CVA.  Patient already treated here with migraine cocktail patient received Decadron 10 mg Benadryl 50 mg and Reglan 10 mg IV and a liter of fluid.  Discussed with Dr. Ardith Dark ED physician at [facility] will accept patient for MRI.  MRI is already been ordered.  Onset of symptoms were yesterday at 8 AM in the morning.  Upon arrival here patient was already beyond 24 hours.  Patient did not meet criteria for Lucianne Lei or for code stroke.  Final Clinical Impressions(s) / ED Diagnoses   Final  diagnoses:  Complicated migraine, intractable    ED Discharge Orders    None       Fredia Sorrow, MD 03/27/18 1303

## 2018-03-27 NOTE — ED Provider Notes (Signed)
3:14 PM discussed with Dr. Leonel Ramsay.  He recommends Toradol and IV Depacon, 1000 mg.  MRI brain.  Probably will need treatment with gabapentin if all else is negative given the burning sensation which could be a neuropathic pain such as trigeminal neuralgia.  Patient is feeling better, burning pain is gone but tingling and some discomfort remains.  MRI is negative.  Lab work is reassuring.  Discussed with Dr. Leonel Ramsay, given trigeminal neuralgia still a possibility, start with gabapentin 300 mg 3 times daily and have her follow-up with neurology.  Otherwise stable for discharge.  Results for orders placed or performed during the hospital encounter of 03/27/18  Ethanol  Result Value Ref Range   Alcohol, Ethyl (B) <10 <10 mg/dL  Protime-INR  Result Value Ref Range   Prothrombin Time 12.6 11.4 - 15.2 seconds   INR 0.95   APTT  Result Value Ref Range   aPTT 27 24 - 36 seconds  CBC  Result Value Ref Range   WBC 5.7 4.0 - 10.5 K/uL   RBC 4.68 3.87 - 5.11 MIL/uL   Hemoglobin 14.0 12.0 - 15.0 g/dL   HCT 41.4 36.0 - 46.0 %   MCV 88.5 78.0 - 100.0 fL   MCH 29.9 26.0 - 34.0 pg   MCHC 33.8 30.0 - 36.0 g/dL   RDW 12.9 11.5 - 15.5 %   Platelets 223 150 - 400 K/uL  Differential  Result Value Ref Range   Neutrophils Relative % 47 %   Neutro Abs 2.7 1.7 - 7.7 K/uL   Lymphocytes Relative 42 %   Lymphs Abs 2.4 0.7 - 4.0 K/uL   Monocytes Relative 7 %   Monocytes Absolute 0.4 0.1 - 1.0 K/uL   Eosinophils Relative 3 %   Eosinophils Absolute 0.2 0.0 - 0.7 K/uL   Basophils Relative 1 %   Basophils Absolute 0.0 0.0 - 0.1 K/uL  Comprehensive metabolic panel  Result Value Ref Range   Sodium 142 135 - 145 mmol/L   Potassium 4.1 3.5 - 5.1 mmol/L   Chloride 107 98 - 111 mmol/L   CO2 27 22 - 32 mmol/L   Glucose, Bld 95 70 - 99 mg/dL   BUN 9 6 - 20 mg/dL   Creatinine, Ser 0.65 0.44 - 1.00 mg/dL   Calcium 9.5 8.9 - 10.3 mg/dL   Total Protein 7.7 6.5 - 8.1 g/dL   Albumin 4.3 3.5 - 5.0 g/dL    AST 21 15 - 41 U/L   ALT 21 0 - 44 U/L   Alkaline Phosphatase 64 38 - 126 U/L   Total Bilirubin 0.5 0.3 - 1.2 mg/dL   GFR calc non Af Amer >60 >60 mL/min   GFR calc Af Amer >60 >60 mL/min   Anion gap 8 5 - 15  Troponin I  Result Value Ref Range   Troponin I <0.03 <0.03 ng/mL  Urine rapid drug screen (hosp performed)  Result Value Ref Range   Opiates NONE DETECTED NONE DETECTED   Cocaine NONE DETECTED NONE DETECTED   Benzodiazepines DETECTED (A) NONE DETECTED   Amphetamines NONE DETECTED NONE DETECTED   Tetrahydrocannabinol NONE DETECTED NONE DETECTED   Barbiturates (A) NONE DETECTED    Result not available. Reagent lot number recalled by manufacturer.  Urinalysis, Routine w reflex microscopic  Result Value Ref Range   Color, Urine YELLOW YELLOW   APPearance CLEAR CLEAR   Specific Gravity, Urine <1.005 (L) 1.005 - 1.030   pH 6.5 5.0 - 8.0   Glucose,  UA NEGATIVE NEGATIVE mg/dL   Hgb urine dipstick NEGATIVE NEGATIVE   Bilirubin Urine NEGATIVE NEGATIVE   Ketones, ur NEGATIVE NEGATIVE mg/dL   Protein, ur NEGATIVE NEGATIVE mg/dL   Nitrite NEGATIVE NEGATIVE   Leukocytes, UA NEGATIVE NEGATIVE   Ct Angio Head W Or Wo Contrast  Result Date: 03/27/2018 CLINICAL DATA:  Acute onset of left-sided facial numbness and dizziness beginning this morning. EXAM: CT ANGIOGRAPHY HEAD AND NECK TECHNIQUE: Multidetector CT imaging of the head and neck was performed using the standard protocol during bolus administration of intravenous contrast. Multiplanar CT image reconstructions and MIPs were obtained to evaluate the vascular anatomy. Carotid stenosis measurements (when applicable) are obtained utilizing NASCET criteria, using the distal internal carotid diameter as the denominator. CONTRAST:  146mL ISOVUE-370 IOPAMIDOL (ISOVUE-370) INJECTION 76% COMPARISON:  None. FINDINGS: CT HEAD FINDINGS Brain: The noncontrast images the brain demonstrate no acute or subacute infarction. No hemorrhage or mass lesion  is present. Basal ganglia are intact. Insular ribbon is normal. No focal cortical lesions are present. The brainstem and cerebellum are normal. Vascular: No hyperdense vessel or unexpected calcification. Skull: The skull base is within normal limits. Sinuses: The paranasal sinuses and mastoid air cells are clear. Orbits: Globes and orbits are within normal limits. Review of the MIP images confirms the above findings CTA NECK FINDINGS Aortic arch: A 3 vessel arch configuration is present. Aortic arch is unremarkable. Right carotid system: The right carotid artery is within normal limits. Bifurcation is unremarkable. Cervical right ICA is normal. Left carotid system: The left common carotid artery is within normal limits. Bifurcation is unremarkable. Cervical left ICA is normal. Vertebral arteries: The vertebral arteries originate from the subclavian arteries without significant stenosis. The vertebral arteries are codominant. No focal stenosis or vascular injury is present to either vertebral artery in the neck. Skeleton: Anterior cervical spine fusion present at C5-6 and C6-7. Vertebral body heights and alignment are otherwise normal. Other neck: The soft tissues of the neck are otherwise unremarkable. Thyroid is normal. No significant adenopathy is present. Salivary glands are within normal limits. Upper chest: Lung apices are clear. Thoracic inlet is within normal limits. Review of the MIP images confirms the above findings CTA HEAD FINDINGS Anterior circulation: Internal carotid arteries are within normal limits from the skull base through the ICA termini bilaterally. The A1 and M1 segments are normal. Anterior communicating artery is patent. MCA bifurcations are normal. The ACA and MCA branch vessels are within normal limits. Posterior circulation: Vertebral arteries are codominant. PICA origins are visualized and within normal limits. Vertebrobasilar junction is within normal limits. Both posterior cerebral  arteries originate basilar tip. PCA branch vessels are within normal limits. Venous sinuses: Dural sinuses are patent. The straight sinus and deep cerebral veins are intact. Cortical veins are normal. Anatomic variants: None Delayed phase: The postcontrast images demonstrate no pathologic enhancement gray-white differentiation is preserved. Review of the MIP images confirms the above findings IMPRESSION: 1. Negative CTA of the neck. 2. Normal CTA circle-of-Willis without significant proximal stenosis, aneurysm, or branch vessel occlusion. 3. No acute or focal lesion to explain left-sided facial numbness. 4. Postsurgical changes at C5-6 and C6-7. Electronically Signed   By: San Morelle M.D.   On: 03/27/2018 11:39   Ct Angio Neck W Or Wo Contrast  Result Date: 03/27/2018 CLINICAL DATA:  Acute onset of left-sided facial numbness and dizziness beginning this morning. EXAM: CT ANGIOGRAPHY HEAD AND NECK TECHNIQUE: Multidetector CT imaging of the head and neck was performed using  the standard protocol during bolus administration of intravenous contrast. Multiplanar CT image reconstructions and MIPs were obtained to evaluate the vascular anatomy. Carotid stenosis measurements (when applicable) are obtained utilizing NASCET criteria, using the distal internal carotid diameter as the denominator. CONTRAST:  198mL ISOVUE-370 IOPAMIDOL (ISOVUE-370) INJECTION 76% COMPARISON:  None. FINDINGS: CT HEAD FINDINGS Brain: The noncontrast images the brain demonstrate no acute or subacute infarction. No hemorrhage or mass lesion is present. Basal ganglia are intact. Insular ribbon is normal. No focal cortical lesions are present. The brainstem and cerebellum are normal. Vascular: No hyperdense vessel or unexpected calcification. Skull: The skull base is within normal limits. Sinuses: The paranasal sinuses and mastoid air cells are clear. Orbits: Globes and orbits are within normal limits. Review of the MIP images confirms  the above findings CTA NECK FINDINGS Aortic arch: A 3 vessel arch configuration is present. Aortic arch is unremarkable. Right carotid system: The right carotid artery is within normal limits. Bifurcation is unremarkable. Cervical right ICA is normal. Left carotid system: The left common carotid artery is within normal limits. Bifurcation is unremarkable. Cervical left ICA is normal. Vertebral arteries: The vertebral arteries originate from the subclavian arteries without significant stenosis. The vertebral arteries are codominant. No focal stenosis or vascular injury is present to either vertebral artery in the neck. Skeleton: Anterior cervical spine fusion present at C5-6 and C6-7. Vertebral body heights and alignment are otherwise normal. Other neck: The soft tissues of the neck are otherwise unremarkable. Thyroid is normal. No significant adenopathy is present. Salivary glands are within normal limits. Upper chest: Lung apices are clear. Thoracic inlet is within normal limits. Review of the MIP images confirms the above findings CTA HEAD FINDINGS Anterior circulation: Internal carotid arteries are within normal limits from the skull base through the ICA termini bilaterally. The A1 and M1 segments are normal. Anterior communicating artery is patent. MCA bifurcations are normal. The ACA and MCA branch vessels are within normal limits. Posterior circulation: Vertebral arteries are codominant. PICA origins are visualized and within normal limits. Vertebrobasilar junction is within normal limits. Both posterior cerebral arteries originate basilar tip. PCA branch vessels are within normal limits. Venous sinuses: Dural sinuses are patent. The straight sinus and deep cerebral veins are intact. Cortical veins are normal. Anatomic variants: None Delayed phase: The postcontrast images demonstrate no pathologic enhancement gray-white differentiation is preserved. Review of the MIP images confirms the above findings  IMPRESSION: 1. Negative CTA of the neck. 2. Normal CTA circle-of-Willis without significant proximal stenosis, aneurysm, or branch vessel occlusion. 3. No acute or focal lesion to explain left-sided facial numbness. 4. Postsurgical changes at C5-6 and C6-7. Electronically Signed   By: San Morelle M.D.   On: 03/27/2018 11:39   Mr Brain Wo Contrast (neuro Protocol)  Result Date: 03/27/2018 CLINICAL DATA:  Bifrontal headache.  Left facial numbness. EXAM: MRI HEAD WITHOUT CONTRAST TECHNIQUE: Multiplanar, multiecho pulse sequences of the brain and surrounding structures were obtained without intravenous contrast. COMPARISON:  Head CT/CTA 03/27/2018 FINDINGS: BRAIN: There is no acute infarct, acute hemorrhage or mass effect. The midline structures are normal. There are no old infarcts. The white matter signal is normal for the patient's age. The CSF spaces are normal for age, with no hydrocephalus. Susceptibility-sensitive sequences show no chronic microhemorrhage or superficial siderosis. VASCULAR: Major intracranial arterial and venous sinus flow voids are preserved. SKULL AND UPPER CERVICAL SPINE: The visualized skull base, calvarium, upper cervical spine and extracranial soft tissues are normal. SINUSES/ORBITS: No fluid levels  or advanced mucosal thickening. No mastoid or middle ear effusion. The orbits are normal. IMPRESSION: Normal brain. Electronically Signed   By: Ulyses Jarred M.D.   On: 03/27/2018 17:43      Sherwood Gambler, MD 03/27/18 [phone]

## 2018-03-27 NOTE — ED Notes (Signed)
Pt returned from MRI °

## 2018-03-27 NOTE — ED Triage Notes (Signed)
Pt having left sided burning and numbness that started at 8 am yesterday at scalp. This am she woke up at 0430 with left sided facial numbness and burning.  Pt states her right arm has a small area of numbness that also started this am.  Pt states she also has a headache.  Grips equal, pt believes left leg feels weak but no deficits detected.  Pt states she has some difficulty speaking due to numbness.

## 2018-03-27 NOTE — Discharge Instructions (Addendum)
If you develop worsening headache/pain, or if you develop vomiting, weakness or numbness in your arms or legs, trouble speaking, fever, or any other new/concerning symptoms return to the ER for evaluation.  Otherwise follow-up with neurology in about 1 week.

## 2018-03-28 ENCOUNTER — Encounter: Payer: Self-pay | Admitting: Neurology

## 2018-04-30 ENCOUNTER — Encounter: Payer: Self-pay | Admitting: Neurology

## 2018-05-13 ENCOUNTER — Ambulatory Visit: Payer: PRIVATE HEALTH INSURANCE | Admitting: Neurology

## 2018-05-15 ENCOUNTER — Ambulatory Visit: Payer: PRIVATE HEALTH INSURANCE | Admitting: Neurology

## 2018-08-29 IMAGING — MR MR HEAD W/O CM
9 of 10 series · 35 of 48 positions shown · non-contrast
Comparison: Head CT/CTA 03/27/2018

CLINICAL DATA: Bifrontal headache.  Left facial numbness.

EXAM:
MRI HEAD WITHOUT CONTRAST
TECHNIQUE: Multiplanar, multiecho pulse sequences of the brain and surrounding
structures were obtained without intravenous contrast.

[Series 3: DWI · axial · 3.0mm · 1.09mm/px · z∈[-63,+88]mm · 8 of 104 slices shown (1 of 4)]
[im 1/104]
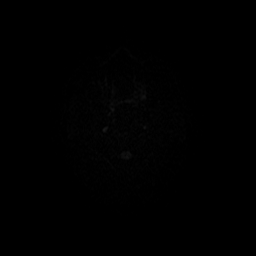
[im 12/104]
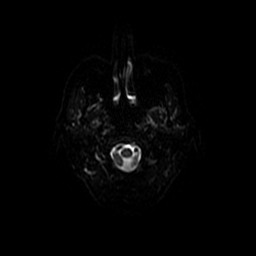
[im 35/104]
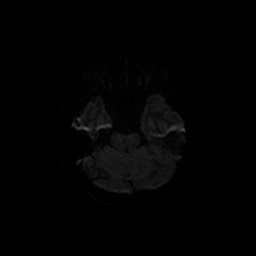
[im 46/104]
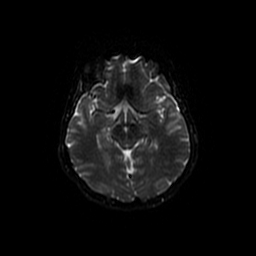
[im 58/104]
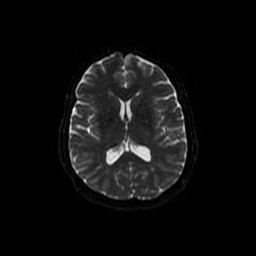
[im 69/104]
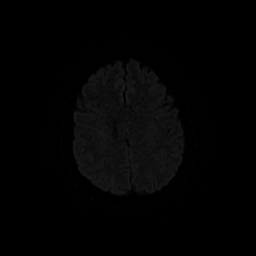
[im 92/104]
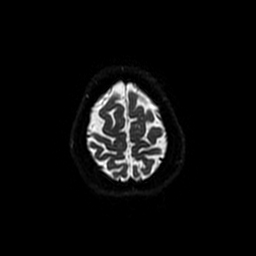
[im 104/104]
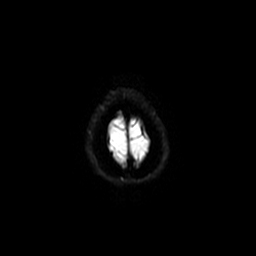

[Series 4: FLAIR · axial · 3.0mm · 0.47mm/px · z∈[-63,+86]mm · 3 of 26 slices shown]
[im 1/26]
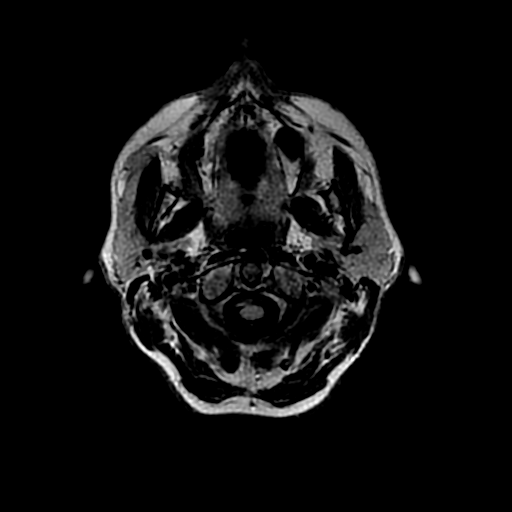
[im 13/26]
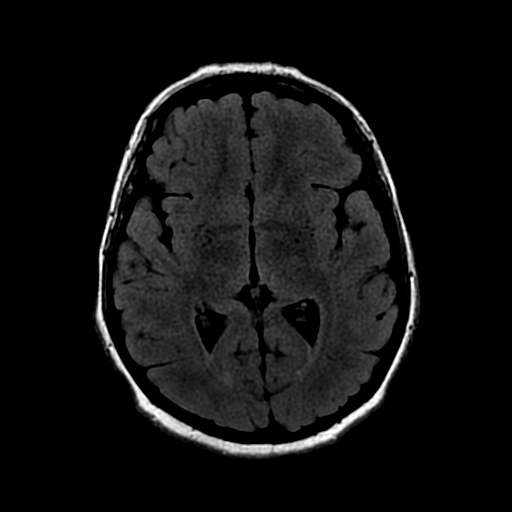
[im 26/26]
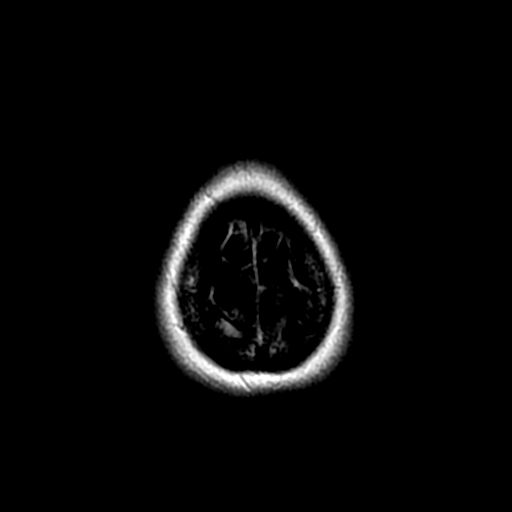

[Series 5: ax mpgr · axial · 5.0mm · 0.43mm/px · 1 of 22 slices shown]
[im 1/22]
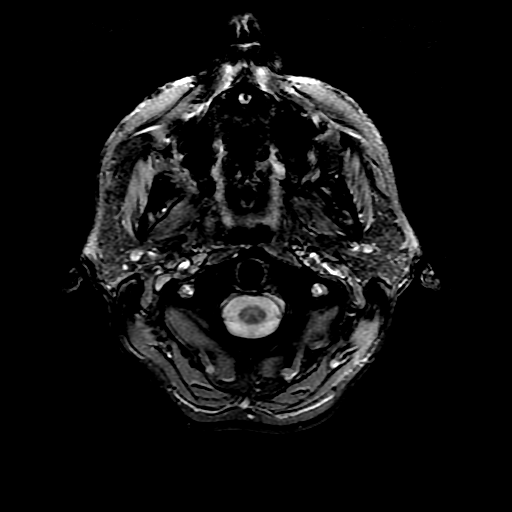

[Series 6: T2 · axial · 5.0mm · 0.47mm/px · z∈[-63,+86]mm · 3 of 26 slices shown (1 of 2)]
[im 1/26]
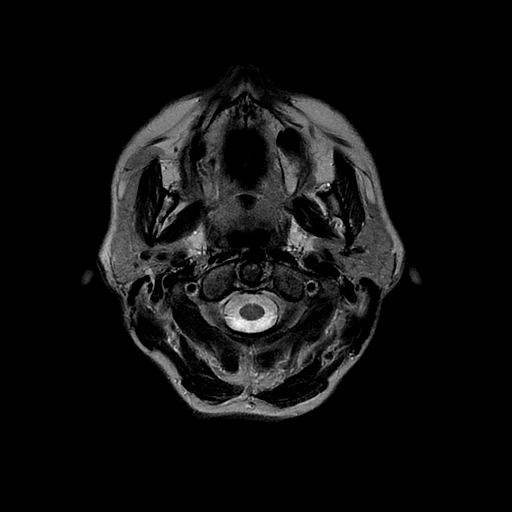
[im 13/26]
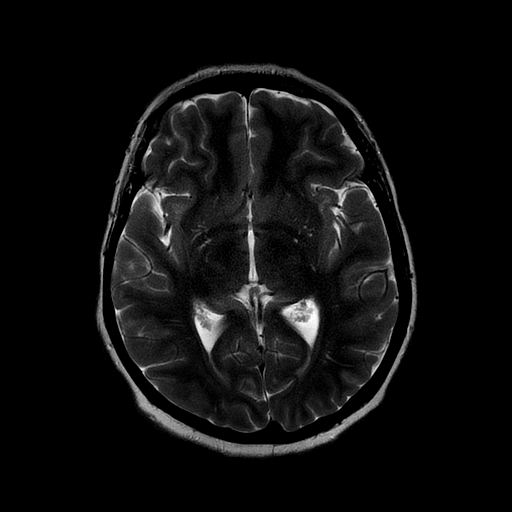
[im 26/26]
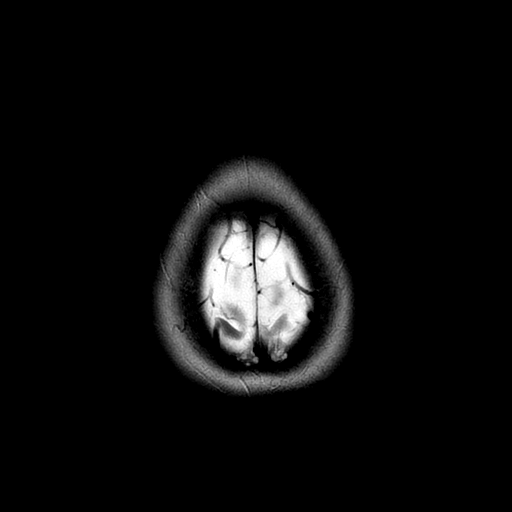

[Series 7: DWI · coronal · 5.0mm · 1.09mm/px · 7 of 68 slices shown (2 of 4)]
[im 1/68]
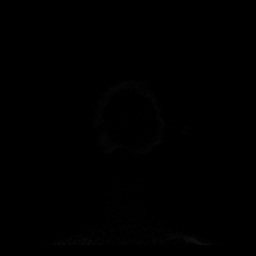
[im 12/68]
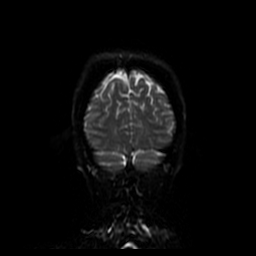
[im 23/68]
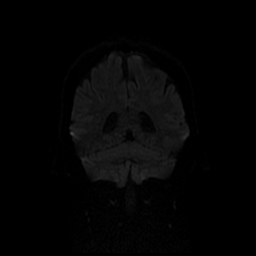
[im 34/68]
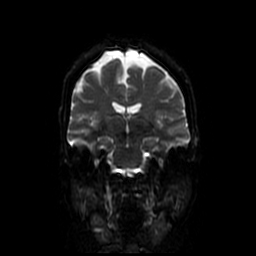
[im 45/68]
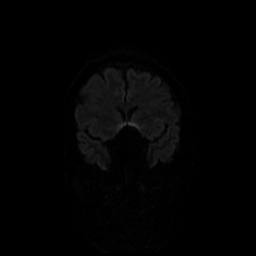
[im 56/68]
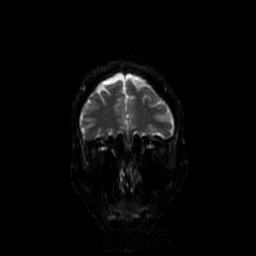
[im 68/68]
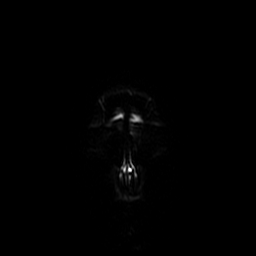

[Series 8: T1 · sagittal · 5.0mm · 0.47mm/px · 2 of 23 slices shown]
[im 1/23]
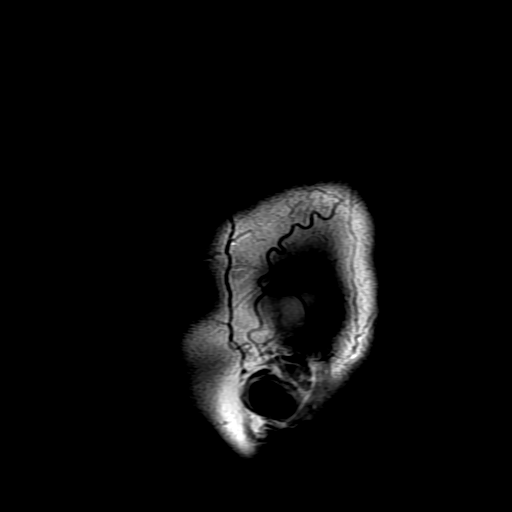
[im 23/23]
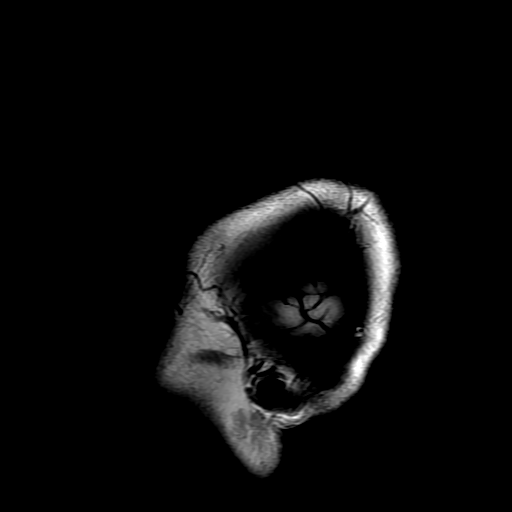

[Series 10: T2 · coronal · 5.0mm · 0.43mm/px · 3 of 29 slices shown (2 of 2)]
[im 1/29]
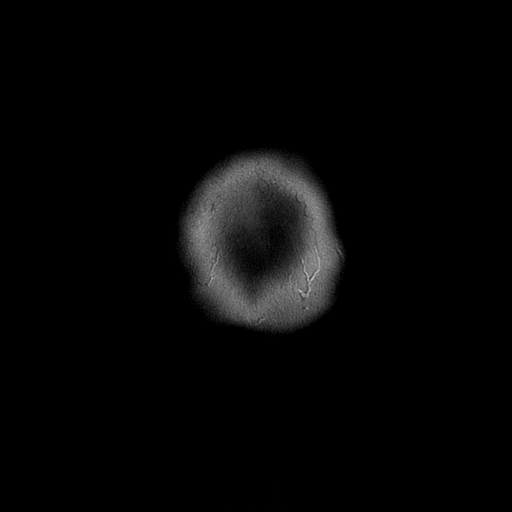
[im 15/29]
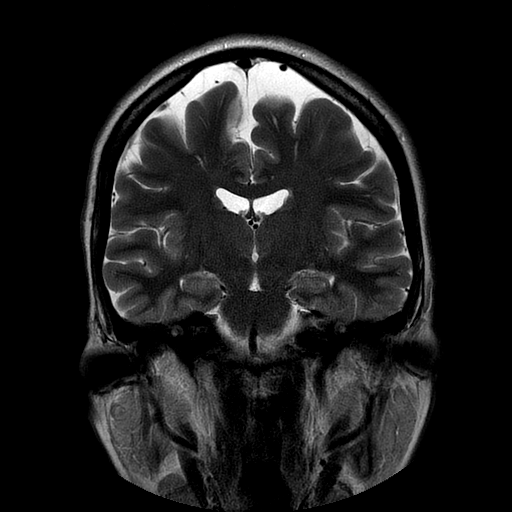
[im 29/29]
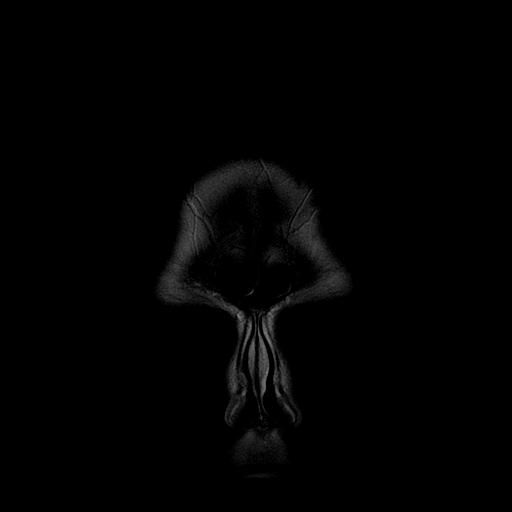

[Series 300: DWI · axial · 3.0mm · 1.09mm/px · z∈[-63,+88]mm · 5 of 52 slices shown (3 of 4)]
[im 1/52]
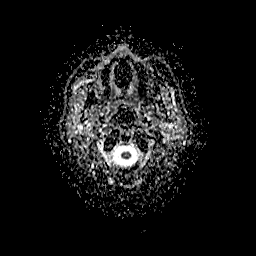
[im 13/52]
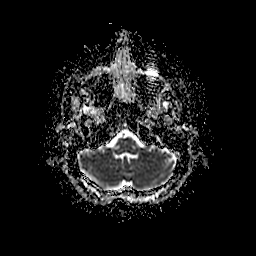
[im 26/52]
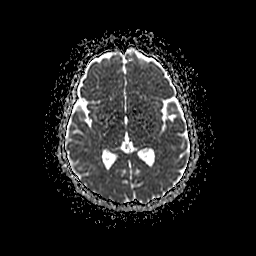
[im 39/52]
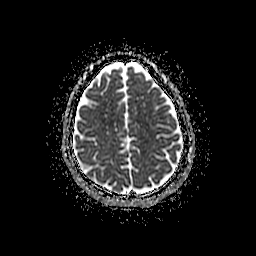
[im 52/52]
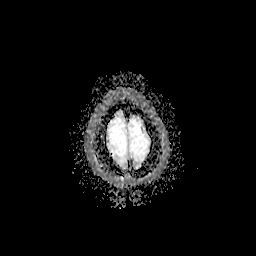

[Series 700: DWI · coronal · 5.0mm · 1.09mm/px · 3 of 34 slices shown (4 of 4)]
[im 1/34]
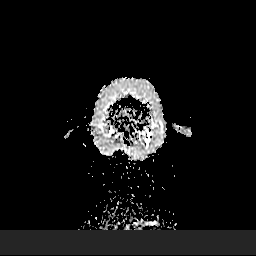
[im 17/34]
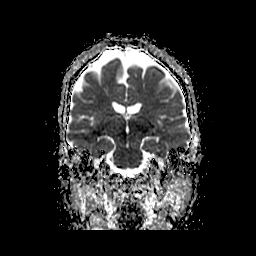
[im 34/34]
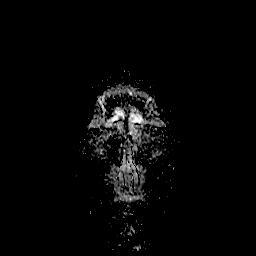

[35 of 48 positions shown; findings below may reference images not displayed]

FINDINGS: BRAIN: There is no acute infarct, acute hemorrhage or mass effect.
The midline structures are normal. There are no old infarcts. The
white matter signal is normal for the patient's age. The CSF spaces
are normal for age, with no hydrocephalus. Susceptibility-sensitive
sequences show no chronic microhemorrhage or superficial siderosis.

VASCULAR: Major intracranial arterial and venous sinus flow voids
are preserved.

SKULL AND UPPER CERVICAL SPINE: The visualized skull base,
calvarium, upper cervical spine and extracranial soft tissues are
normal.

SINUSES/ORBITS: No fluid levels or advanced mucosal thickening. No
mastoid or middle ear effusion. The orbits are normal.
IMPRESSION: Normal brain.

## 2022-04-27 ENCOUNTER — Encounter (HOSPITAL_BASED_OUTPATIENT_CLINIC_OR_DEPARTMENT_OTHER): Payer: Self-pay

## 2022-04-27 ENCOUNTER — Other Ambulatory Visit: Payer: Self-pay

## 2022-04-27 ENCOUNTER — Emergency Department (HOSPITAL_BASED_OUTPATIENT_CLINIC_OR_DEPARTMENT_OTHER)
Admission: EM | Admit: 2022-04-27 | Discharge: 2022-04-27 | Disposition: A | Discharge status: Home / Self Care | Payer: 59 | Attending: Emergency Medicine | Admitting: Emergency Medicine

## 2022-04-27 ENCOUNTER — Emergency Department (HOSPITAL_BASED_OUTPATIENT_CLINIC_OR_DEPARTMENT_OTHER): Payer: 59

## 2022-04-27 DIAGNOSIS — H53129 Transient visual loss, unspecified eye: Secondary | ICD-10-CM | POA: Diagnosis not present

## 2022-04-27 DIAGNOSIS — R0602 Shortness of breath: Secondary | ICD-10-CM | POA: Insufficient documentation

## 2022-04-27 DIAGNOSIS — R0789 Other chest pain: Secondary | ICD-10-CM

## 2022-04-27 DIAGNOSIS — R42 Dizziness and giddiness: Secondary | ICD-10-CM | POA: Diagnosis present

## 2022-04-27 DIAGNOSIS — H539 Unspecified visual disturbance: Secondary | ICD-10-CM

## 2022-04-27 DIAGNOSIS — Z853 Personal history of malignant neoplasm of breast: Secondary | ICD-10-CM | POA: Insufficient documentation

## 2022-04-27 DIAGNOSIS — R55 Syncope and collapse: Secondary | ICD-10-CM | POA: Insufficient documentation

## 2022-04-27 DIAGNOSIS — R072 Precordial pain: Secondary | ICD-10-CM | POA: Diagnosis not present

## 2022-04-27 DIAGNOSIS — R002 Palpitations: Secondary | ICD-10-CM | POA: Diagnosis not present

## 2022-04-27 LAB — CBC WITH DIFFERENTIAL/PLATELET
Abs Immature Granulocytes: 0.01 10*3/uL (ref 0.00–0.07)
Basophils Absolute: 0.1 10*3/uL (ref 0.0–0.1)
Basophils Relative: 1 %
Eosinophils Absolute: 0.2 10*3/uL (ref 0.0–0.5)
Eosinophils Relative: 3 %
HCT: 38.9 % (ref 36.0–46.0)
Hemoglobin: 13.1 g/dL (ref 12.0–15.0)
Immature Granulocytes: 0 %
Lymphocytes Relative: 39 %
Lymphs Abs: 2.3 10*3/uL (ref 0.7–4.0)
MCH: 30.3 pg (ref 26.0–34.0)
MCHC: 33.7 g/dL (ref 30.0–36.0)
MCV: 89.8 fL (ref 80.0–100.0)
Monocytes Absolute: 0.4 10*3/uL (ref 0.1–1.0)
Monocytes Relative: 8 %
Neutro Abs: 2.9 10*3/uL (ref 1.7–7.7)
Neutrophils Relative %: 49 %
Platelets: 290 10*3/uL (ref 150–400)
RBC: 4.33 MIL/uL (ref 3.87–5.11)
RDW: 12.5 % (ref 11.5–15.5)
WBC: 5.9 10*3/uL (ref 4.0–10.5)
nRBC: 0 % (ref 0.0–0.2)

## 2022-04-27 LAB — COMPREHENSIVE METABOLIC PANEL
ALT: 46 U/L — ABNORMAL HIGH (ref 0–44)
AST: 31 U/L (ref 15–41)
Albumin: 4.4 g/dL (ref 3.5–5.0)
Alkaline Phosphatase: 82 U/L (ref 38–126)
Anion gap: 8 (ref 5–15)
BUN: 7 mg/dL (ref 6–20)
CO2: 26 mmol/L (ref 22–32)
Calcium: 9.9 mg/dL (ref 8.9–10.3)
Chloride: 105 mmol/L (ref 98–111)
Creatinine, Ser: 0.77 mg/dL (ref 0.44–1.00)
GFR, Estimated: >60 mL/min (ref >60)
Glucose, Bld: 105 mg/dL — ABNORMAL HIGH (ref 70–99)
Potassium: 3.8 mmol/L (ref 3.5–5.1)
Sodium: 139 mmol/L (ref 135–145)
Total Bilirubin: 0.4 mg/dL (ref 0.3–1.2)
Total Protein: 6.9 g/dL (ref 6.5–8.1)

## 2022-04-27 LAB — TROPONIN I (HIGH SENSITIVITY)
Troponin I (High Sensitivity): <2 ng/L (ref <18)
Troponin I (High Sensitivity): <2 ng/L (ref <18)

## 2022-04-27 LAB — LIPASE, BLOOD: Lipase: 37 U/L (ref 11–51)

## 2022-04-27 LAB — TSH: TSH: 2.097 u[IU]/mL (ref 0.350–4.500)

## 2022-04-27 MED ORDER — DIPHENHYDRAMINE HCL 50 MG/ML IJ SOLN
50.0000 mg | Freq: Once | INTRAMUSCULAR | Status: AC
  Administered 2022-04-27: 50 mg via INTRAVENOUS
  Filled 2022-04-27: qty 1

## 2022-04-27 MED ORDER — IOHEXOL 350 MG/ML SOLN
100.0000 mL | Freq: Once | INTRAVENOUS | Status: AC | PRN
  Administered 2022-04-27: 150 mL via INTRAVENOUS

## 2022-04-27 MED ORDER — SODIUM CHLORIDE 0.9 % IV BOLUS
1000.0000 mL | Freq: Once | INTRAVENOUS | Status: AC
  Administered 2022-04-27: 1000 mL via INTRAVENOUS

## 2022-04-27 MED ORDER — PROCHLORPERAZINE EDISYLATE 10 MG/2ML IJ SOLN
10.0000 mg | Freq: Once | INTRAMUSCULAR | Status: AC
  Administered 2022-04-27: 10 mg via INTRAVENOUS
  Filled 2022-04-27: qty 2

## 2022-04-27 NOTE — ED Provider Notes (Signed)
[facility] Provider Note   CSN: [id number] Arrival date & time: 04/27/22  0857     History  Chief Complaint  Patient presents with   Chest Pain    Evelyn Garcia is a 46 y.o. female.  The history is provided by the patient, the spouse and medical records. No language interpreter was used.  Chest Pain Pain location:  Substernal area and L chest Pain quality: aching, dull and pressure   Pain radiates to:  Does not radiate Pain severity:  Severe Onset quality:  Sudden Duration:  2 days Timing:  Intermittent Progression:  Waxing and waning Chronicity:  New Relieved by:  Nothing Worsened by:  Nothing Ineffective treatments:  None tried Associated symptoms: anxiety, dizziness, fatigue, headache, nausea, near-syncope, palpitations and shortness of breath   Associated symptoms: no abdominal pain, no back pain, no cough, no diaphoresis, no fever, no lower extremity edema, no numbness, no vomiting and no weakness        Home Medications Prior to Admission medications   Medication Sig Start Date End Date Taking? Authorizing Provider  ALPRAZolam Duanne Moron) 0.25 MG tablet Take 1 tablet by mouth at bedtime as needed. 03/25/18   [provider]  gabapentin (NEURONTIN) 300 MG capsule Take 1 capsule (300 mg total) by mouth 3 (three) times daily for 7 days. 03/27/18 04/03/18  Sherwood Gambler, MD      Allergies    Erythromycin base, Hydrocodone, Penicillins, and Prednisone    Review of Systems   Review of Systems  Constitutional:  Positive for fatigue. Negative for diaphoresis and fever.  HENT:  Negative for congestion.   Eyes:  Positive for visual disturbance.  Respiratory:  Positive for chest tightness and shortness of breath. Negative for cough, wheezing and stridor.   Cardiovascular:  Positive for chest pain, palpitations and near-syncope. Negative for leg swelling.  Gastrointestinal:  Positive for nausea. Negative for abdominal pain,  constipation, diarrhea and vomiting.  Genitourinary:  Negative for dysuria and flank pain.  Musculoskeletal:  Negative for back pain, neck pain and neck stiffness.  Skin:  Negative for rash and wound.  Neurological:  Positive for dizziness, light-headedness and headaches. Negative for seizures, syncope, facial asymmetry, speech difficulty, weakness and numbness.  Psychiatric/Behavioral:  Negative for agitation and confusion.   All other systems reviewed and are negative.   Physical Exam Updated Vital Signs BP 115/77 (BP Location: Right Arm)   Pulse 75   Temp 97.6 F (36.4 C) (Oral)   Resp 11   Ht '5\' 6"'$  (1.676 m)   Wt 78.1 kg   SpO2 99%   BMI 27.79 kg/m  Physical Exam Vitals and nursing note reviewed.  Constitutional:      General: She is not in acute distress.    Appearance: She is well-developed. She is not ill-appearing, toxic-appearing or diaphoretic.  HENT:     Head: Normocephalic and atraumatic.  Eyes:     Conjunctiva/sclera: Conjunctivae normal.     Pupils: Pupils are equal, round, and reactive to light.  Cardiovascular:     Rate and Rhythm: Normal rate and regular rhythm.     Heart sounds: Normal heart sounds. No murmur heard. Pulmonary:     Effort: Pulmonary effort is normal. No respiratory distress.     Breath sounds: Normal breath sounds. No decreased breath sounds, wheezing, rhonchi or rales.  Chest:     Chest wall: No tenderness.  Abdominal:     General: Bowel sounds are normal.     Palpations:  Abdomen is soft.     Tenderness: There is no abdominal tenderness. There is no guarding or rebound.  Musculoskeletal:        General: No swelling. Normal range of motion.     Cervical back: Neck supple.     Right lower leg: No tenderness. No edema.     Left lower leg: No tenderness. No edema.  Skin:    General: Skin is warm and dry.     Capillary Refill: Capillary refill takes less than 2 seconds.     Coloration: Skin is not pale.     Findings: No erythema.   Neurological:     General: No focal deficit present.     Mental Status: She is alert.  Psychiatric:        Mood and Affect: Mood normal.    ED Results / Procedures / Treatments   Labs (all labs ordered are listed, but only abnormal results are displayed) Labs Reviewed  COMPREHENSIVE METABOLIC PANEL - Abnormal; Notable for the following components:      Result Value   Glucose, Bld 105 (*)    ALT 46 (*)    All other components within normal limits  CBC WITH DIFFERENTIAL/PLATELET  LIPASE, BLOOD  TSH  TROPONIN I (HIGH SENSITIVITY)  TROPONIN I (HIGH SENSITIVITY)    EKG EKG Interpretation  Date/Time:  Thursday April 27 2022 09:08:20 EDT Ventricular Rate:  68 PR Interval:  218 QRS Duration: 88 QT Interval:  399 QTC Calculation: 425 R Axis:   53 Text Interpretation: Sinus rhythm Prolonged PR interval Low voltage, precordial leads Baseline wander in lead(s) V5 when compared to prior, more wandering baseline. No sTEMI Confirmed by Antony Blackbird [phone]) on 04/27/2022 9:31:31 AM  Radiology CT ANGIO HEAD NECK W WO CM  Result Date: 04/27/2022 CLINICAL DATA:  Syncope/presyncope, cerebrovascular cause suspected History of cancer, chest pain, short of breath, near syncope, also headache, dizziness, and vision sliding to the left during episode yesterday. EXAM: CT ANGIOGRAPHY HEAD AND NECK TECHNIQUE: Multidetector CT imaging of the head and neck was performed using the standard protocol during bolus administration of intravenous contrast. Multiplanar CT image reconstructions and MIPs were obtained to evaluate the vascular anatomy. Carotid stenosis measurements (when applicable) are obtained utilizing NASCET criteria, using the distal internal carotid diameter as the denominator. RADIATION DOSE REDUCTION: This exam was performed according to the departmental dose-optimization program which includes automated exposure control, adjustment of the mA and/or kV according to patient size and/or use of  iterative reconstruction technique. CONTRAST:  118m OMNIPAQUE IOHEXOL 350 MG/ML SOLN COMPARISON:  CTA March 27, 2018. FINDINGS: CT HEAD FINDINGS Brain: No evidence of acute large vascular territory infarction, hemorrhage, hydrocephalus, extra-axial collection or mass lesion/mass effect. Vascular: See below. Skull: No acute fracture. Sinuses: Clear sinuses. Orbits: No acute finding. Review of the MIP images confirms the above findings CTA NECK FINDINGS Aortic arch: Great vessel origins are patent. Right carotid system: No evidence of dissection, stenosis (50% or greater) or occlusion. Left carotid system: No evidence of dissection, stenosis (50% or greater) or occlusion. Vertebral arteries: Codominant. No visible evidence of dissection, stenosis (50% or greater) or occlusion. Streak artifact from ACDF limits assessment. Skeleton: C5 C7 ACDF with interbody spacers. Other neck: No acute findings. Upper chest: Visualized lung apices are clear. Review of the MIP images confirms the above findings CTA HEAD FINDINGS Anterior circulation: Bilateral intracranial ICAs, MCAs, and ACAs are patent without proximal hemodynamically significant stenosis. Posterior circulation: Bilateral intradural vertebral arteries basilar arteries, and  bilateral posterior cerebral arteries are patent without proximal hemodynamically significant stenosis. Venous sinuses: As permitted by contrast timing, patent. Review of the MIP images confirms the above findings IMPRESSION: 1. No evidence of acute intracranial abnormality. 2. No emergent large vessel occlusion or proximal hemodynamically significant stenosis in the head or neck. Electronically Signed   By: Margaretha Sheffield M.D.   On: 04/27/2022 11:55   CT Angio Chest PE W and/or Wo Contrast  Result Date: 04/27/2022 CLINICAL DATA:  Pulmonary embolism suspected, high probability. History cancer. Chest pain with shortness of breath and near syncopal episode. EXAM: CT ANGIOGRAPHY CHEST WITH  CONTRAST TECHNIQUE: Multidetector CT imaging of the chest was performed using the standard protocol during bolus administration of intravenous contrast. Multiplanar CT image reconstructions and MIPs were obtained to evaluate the vascular anatomy. Two acquisitions were obtained. RADIATION DOSE REDUCTION: This exam was performed according to the departmental dose-optimization program which includes automated exposure control, adjustment of the mA and/or kV according to patient size and/or use of iterative reconstruction technique. CONTRAST:  178m OMNIPAQUE IOHEXOL 350 MG/ML SOLN COMPARISON:  Chest radiographs 09/07/2021 and 10/13/2020. FINDINGS: Cardiovascular: The pulmonary arteries are well opacified with contrast to the level of the subsegmental branches. There is no evidence of acute pulmonary embolism. No acute or significant systemic arterial abnormalities are seen. The heart size is normal. There is no pericardial effusion. Mediastinum/Nodes: There are no enlarged mediastinal, hilar or axillary lymph nodes. The thyroid gland, trachea and esophagus demonstrate no significant findings. Lungs/Pleura: No pleural effusion or pneumothorax. Mild dependent atelectasis in both lungs. No confluent airspace opacity or suspicious pulmonary nodule. Upper abdomen: No significant findings are seen within the visualized upper abdomen. Musculoskeletal/Chest wall: There is no chest wall mass or suspicious osseous finding. Bilateral breast implants are noted. Previous lower cervical fusion. Review of the MIP images confirms the above findings. IMPRESSION: 1. No evidence of acute pulmonary embolism or other acute chest findings. 2. Mild dependent atelectasis in both lungs. Electronically Signed   By: WRichardean SaleM.D.   On: 04/27/2022 11:53    Procedures Procedures    Medications Ordered in ED Medications  iohexol (OMNIPAQUE) 350 MG/ML injection 100 mL (150 mLs Intravenous Contrast Given 04/27/22 1139)  sodium chloride  0.9 % bolus 1,000 mL (0 mLs Intravenous Stopped 04/27/22 1404)  prochlorperazine (COMPAZINE) injection 10 mg (10 mg Intravenous Given 04/27/22 1229)  diphenhydrAMINE (BENADRYL) injection 50 mg (50 mg Intravenous Given 04/27/22 1228)    ED Course/ Medical Decision Making/ A&P                           Medical Decision Making Amount and/or Complexity of Data Reviewed Labs: ordered. Radiology: ordered.  Risk Prescription drug management.    MBabe Anthisis a 46y.o. female with a past medical history significant for breast cancer status post bilateral mastectomy, hysterectomy, previous neck surgery with chronic left arm numbness/tingling symptoms, IBS, previous pneumonia, GERD, and hiatal hernia who presents with 2 days of intermittent chest pressure, near syncope with lightheadedness, dizziness with left shifting vision, sense of dread, short of breath, and palpitations.  According to patient, she was feeling fatigued for the last few days but did not think much of it.  She says that while at work yesterday she had onset of some palpitations and chest pressure that felt like a weight on her chest.  It did not radiate but she did say that her left arm numbness seem to  be slightly more intense than baseline.  She said that she started having headache and then started having some lightheadedness and near syncope.  She then started having some dizziness and felt that her vision Jumping to the left and everything was falling to the left.  She reports this lasted for several minutes.  She reports no trauma.  Denies any fevers, chills, congestion, or cough.  She reports some nausea but no vomiting.  Denies any constipation, diarrhea, or urinary symptoms.  Denies history of DVT or PE and denies any leg pain or leg swelling.  She reports that it happened yesterday twice and then today prompting her to seek evaluation.  She reports her headache is frontal and diffuse and is like a pressure.  Denies any persistent  vision changes at this time.  On exam, lungs clear.  Chest nontender.  Abdomen nontender.  Normal bowel sounds.  No murmur.  Intact pulses, strength, and sensation in extremities.  Symmetric smile.  Clear speech.  Pupils are symmetric and reactive normal extract movements.  No carotid bruit appreciated.  No neck stiffness or tenderness on my exam.  No rash seen.  Vital signs reassuring on my evaluation.  EKG does not show STEMI and very similar to prior.  Clinically I am unsure of the patient's etiology at this time however I am somewhat concerned that she had this near syncope with chest pain, shortness of breath, and sense of doom with her cancer history.  I am also concerned about the dizzy component and the left shifting vision and feeling that the world was spinning.  Although she reports no metastasis or history of vascular disease going to her head I do feel we need to get imaging.  Given her high risk for clots given her cancer history and the severity of her symptoms with near syncope and sense of doom, we will get a CT PE study as well as CT of the head and neck for the transient neurologic complaint.  We will get screening labs as well.  If work-up is completely reassuring, dissipate discussion with neurology to determine if they feel she needs more advanced imaging such as MRI given the transient neurologic deficit.  Anticipate reassessment after work-up.   12:43 PM CT scans were all reassuring.  CT the chest showed no evidence of PE or nodules and CT of the head and neck did not show evidence of acute vascular abnormality or stroke.  She is still having headaches we agreed to do a headache cocktail.  I called neurology to discuss and they agreed with doing a headache cocktail first and seeing if symptoms remain resolved.  If they do and she is feeling well, they feels reasonable for outpatient follow-up with neurology however if she starts having symptoms or is still concerned, we would  consider transfer for MRI given her history of breast cancer  2:36 PM After headache cocktail symptoms have completely resolved.  She is feeling much better.  She is able to eat and drink and denies any other symptoms.  We had a shared decision-making conversation offering transfer for MRI but she is feeling better and does not want to do this.  We will give her instructions to follow-up with outpatient neurology and PCP.  She understood plan of care and will stay hydrated and rest.  She no other questions or concerns and was discharged in good condition.          Final Clinical Impression(s) / ED Diagnoses Final diagnoses:  Near syncope  Atypical chest pain  Lightheaded  Transient vision disturbance  Palpitations  Clinical Impression: 1. Near syncope   2. Atypical chest pain   3. Lightheaded   4. Transient vision disturbance   5. Palpitations     Disposition: Discharge  Condition: Good  I have discussed the results, Dx and Tx plan with the pt(& family if present). He/she/they expressed understanding and agree(s) with the plan. Discharge instructions discussed at great length. Strict return precautions discussed and pt &/or family have verbalized understanding of the instructions. No further questions at time of discharge.    New Prescriptions   No medications on file    Follow Up: [facility] [address]     [address] [place] [address] [place], [place], [place] [address] [place] [id number] [phone]     [facility] [address] [id number] [place]       Keaden Gunnoe, Gwenyth Allegra, MD 04/27/22 1440

## 2022-04-27 NOTE — ED Triage Notes (Signed)
Pt states symptoms started yesterday. Felt like she was going to pass out and chest felt tight. Happened again on the way home. Pt states she does not feel right and has pressure on chest. Left arm is tingling and numb but not new. Pt had a bad headache and tired.

## 2022-04-27 NOTE — ED Notes (Signed)
Pt given water, graham crackers, and saltines per Tegeler MD for PO challenge.

## 2022-04-27 NOTE — Discharge Instructions (Signed)
Your history, exam, work-up today was overall reassuring.  Do not see any concerning findings on the imaging of her chest, neck, or head.  Please rest and stay hydrated.  Please follow-up with your primary doctor.  Given the transient vision changes and dizziness we did feel is reasonable to have you follow-up with outpatient neurology.  If any symptoms change or worsen acutely, please return to the nearest emergency department.

## 2022-04-27 NOTE — ED Notes (Signed)
Pt able to tolerate food and PO fluids without difficulty. Denies nausea.

## 2022-04-27 NOTE — ED Notes (Signed)
Pt states no pain at this time just feeling uncomfortable

## 2022-04-27 NOTE — ED Notes (Signed)
Pt denies headache, endorses complete relief after medication. Tegeler MD aware.

## 2022-04-27 NOTE — ED Notes (Signed)
Pt verbalizes understanding of discharge instructions. Opportunity for questions and answers were provided. Pt discharged from the ED.   ?
